# Patient Record
Sex: Male | Born: 1990 | Race: Black or African American | Hispanic: No | Marital: Single | Smoking: Never smoker
Health system: Southern US, Community
[De-identification: ages and names within clinical notes are randomized; demographics above are authoritative.]

## PROBLEM LIST (undated history)

## (undated) HISTORY — PX: FINGER SURGERY: SHX640

## (undated) HISTORY — PX: HERNIA REPAIR: SHX51

## (undated) HISTORY — PX: TONSILLECTOMY: SUR1361

---
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
## 2009-04-07 ENCOUNTER — Emergency Department (HOSPITAL_COMMUNITY): Admission: EM | Admit: 2009-04-07 | Discharge: 2009-04-07 | Payer: Self-pay | Admitting: Emergency Medicine

## 2010-10-15 ENCOUNTER — Other Ambulatory Visit: Payer: Self-pay | Admitting: Surgery

## 2010-10-15 ENCOUNTER — Ambulatory Visit
Admission: RE | Admit: 2010-10-15 | Discharge: 2010-10-15 | Disposition: A | Discharge status: Home / Self Care | Payer: Managed Care, Other (non HMO) | Source: Ambulatory Visit | Attending: Surgery | Admitting: Surgery

## 2010-10-15 ENCOUNTER — Encounter (HOSPITAL_BASED_OUTPATIENT_CLINIC_OR_DEPARTMENT_OTHER)
Admission: RE | Admit: 2010-10-15 | Discharge: 2010-10-15 | Disposition: A | Discharge status: Home / Self Care | Payer: Managed Care, Other (non HMO) | Source: Ambulatory Visit | Attending: Surgery | Admitting: Surgery

## 2010-10-15 DIAGNOSIS — Z01811 Encounter for preprocedural respiratory examination: Secondary | ICD-10-CM

## 2010-10-15 LAB — CBC
HCT: 40.3 % (ref 39.0–52.0)
Hemoglobin: 14.4 g/dL (ref 13.0–17.0)
MCH: 32.8 pg (ref 26.0–34.0)
MCHC: 35.7 g/dL (ref 30.0–36.0)
MCV: 91.8 fL (ref 78.0–100.0)
Platelets: 232 10*3/uL (ref 150–400)
RBC: 4.39 MIL/uL (ref 4.22–5.81)
RDW: 11.2 % — ABNORMAL LOW (ref 11.5–15.5)
WBC: 4.3 10*3/uL (ref 4.0–10.5)

## 2010-10-15 LAB — COMPREHENSIVE METABOLIC PANEL
ALT: 11 U/L (ref 0–53)
AST: 19 U/L (ref 0–37)
Albumin: 4.5 g/dL (ref 3.5–5.2)
Alkaline Phosphatase: 41 U/L (ref 39–117)
BUN: 8 mg/dL (ref 6–23)
CO2: 29 mEq/L (ref 19–32)
Calcium: 9.8 mg/dL (ref 8.4–10.5)
Chloride: 102 mEq/L (ref 96–112)
Creatinine, Ser: 0.75 mg/dL (ref 0.4–1.5)
GFR calc Af Amer: >60 mL/min (ref >60)
GFR calc non Af Amer: >60 mL/min (ref >60)
Glucose, Bld: 88 mg/dL (ref 70–99)
Potassium: 3.7 mEq/L (ref 3.5–5.1)
Sodium: 139 mEq/L (ref 135–145)
Total Bilirubin: 1.3 mg/dL — ABNORMAL HIGH (ref 0.3–1.2)
Total Protein: 7.1 g/dL (ref 6.0–8.3)

## 2010-10-15 LAB — DIFFERENTIAL
Basophils Absolute: 0 10*3/uL (ref 0.0–0.1)
Basophils Relative: 1 % (ref 0–1)
Eosinophils Absolute: 0 10*3/uL (ref 0.0–0.7)
Eosinophils Relative: 1 % (ref 0–5)
Lymphocytes Relative: 44 % (ref 12–46)
Lymphs Abs: 1.9 10*3/uL (ref 0.7–4.0)
Monocytes Absolute: 0.4 10*3/uL (ref 0.1–1.0)
Monocytes Relative: 10 % (ref 3–12)
Neutro Abs: 1.9 10*3/uL (ref 1.7–7.7)
Neutrophils Relative %: 45 % (ref 43–77)

## 2010-10-20 ENCOUNTER — Ambulatory Visit (HOSPITAL_BASED_OUTPATIENT_CLINIC_OR_DEPARTMENT_OTHER)
Admission: RE | Admit: 2010-10-20 | Discharge: 2010-10-20 | Disposition: A | Discharge status: Home / Self Care | Payer: Managed Care, Other (non HMO) | Source: Ambulatory Visit | Attending: Surgery | Admitting: Surgery

## 2010-10-20 DIAGNOSIS — K4090 Unilateral inguinal hernia, without obstruction or gangrene, not specified as recurrent: Secondary | ICD-10-CM | POA: Insufficient documentation

## 2010-10-21 NOTE — Op Note (Signed)
Martin Arnold, Martin Arnold                ACCOUNT NO.:  [id number]  MEDICAL RECORD NO.:  [id number]           PATIENT TYPE:  LOCATION:                                 FACILITY:  PHYSICIAN:  Clovis Pu. Jawana Reagor, M.D.DATE OF BIRTH:  1990-07-26  DATE OF PROCEDURE:  10/20/2010 DATE OF DISCHARGE:                              OPERATIVE REPORT   PREOPERATIVE DIAGNOSIS:  Left inguinal hernia.  POSTOPERATIVE DIAGNOSIS:  Left inguinal hernia.  PROCEDURE:  Repair of left inguinal hernia with Ultrapro mesh.  SURGEON:  Maisie Fus A. Tkeyah Burkman, MD  ANESTHESIA:  LMA with 0.25% Sensorcaine local with epinephrine, total of 20 mL used.  ESTIMATED BLOOD LOSS:  Minimal.  SPECIMENS:  None.  INDICATIONS FOR PROCEDURE:  The patient is a 20 year old male with a left inguinal hernia that has gotten progressively larger and more painful.  We discussed the options of repair to include both opened and laparoscopic use of mesh and repair from both, risk of bleeding, risk of infection, risk of chronic pain, risk of chronic numbness, and chronic groin discomfort, risk of recurrence, the risk of injury to the cord structures and testicle, the risk of injury to the bladder and internal organs to include, but exclusive of the iliac vessels and the bladder and bowel.  He understood all the above.  After discussion of both options as well as observation for therapy, he wished to proceed with open repair of his inguinal hernia.  DESCRIPTION OF PROCEDURE:  The patient was brought to the operating room and placed supine after being seen in the holding area and questions answered.  Left side was marked as well in the holding area.  Once brought back to the operating room, he was placed supine.  LMA anesthesia was initiated without difficulty.  The left inguinal region was prepped and draped in sterile fashion.  Time-out was done.  0.25% Sensorcaine was injected in the left inguinal crease and an incision was made.   Dissection was carried down through the Scarpa fascia to identify the fibers of the external oblique.  These were injected with 0.25% Sensorcaine as well.  The fibers were opened in the direction to the external ring.  Cord structures were identified and circled with a half- inch Penrose drain.  The indirect hernia sac was identified and the colon cords was dissected away and reduced back in the preperitoneal space without difficulty.  We then identified the ilioinguinal nerve and retracted down to the field initially.  A large Prolene hernia system consisting of Ultrapro was used, the inner leaflet was placed through the internal ring into the preperitoneal space without difficulty.  It was deployed and the Onlay was placed on the floor of the anal canal without difficulty.  Slit was cut through the cord structures.  The mesh was secured down the pubic tubercle with 0-Vicryl and the connector piece was secured to the internal oblique with 0-Vicryl.  The onlay was then secured down to the shelving edge of the inguinal ligament and to the conjoint tendon and internal oblique medially with #1 Novafil pop- off sutures.  We then secured the mesh edges  together around the cord with #1 Novafil pop-off.  The ilioinguinal nerve was tethered into the mesh and there was no work without tethered, therefore I went ahead and divided the nerve as the muscle to prevent this from being trapped and having tension on which I think will cause pain in this setting, no matter where I put the nerve.  There is ample room for the cord structures to exit the mesh.  I was able to put my finger into the new internal ring without difficulty.  We then closed the external oblique with 2-0 Vicryl.  3-0 Vicryl was used to close the Scarpa fascia and 4-0 Monocryl was used to close the skin in a subcuticular fashion. Dermabond was applied.  All final counts of sponge, needle, and instruments were found to be correct at  this portion of the case.  The patient was awoke, extubated, taken to recovery in satisfactory condition.  All final counts were found to be correct.     Landrum Carbonell A. Lenni Reckner, M.D.     TAC/MEDQ  D:  10/20/2010  T:  10/20/2010  Job:  [id number]  Electronically Signed by Harriette Bouillon M.D. on 10/21/2010 12:17:33 PM

## 2010-11-04 ENCOUNTER — Other Ambulatory Visit (INDEPENDENT_AMBULATORY_CARE_PROVIDER_SITE_OTHER): Payer: Self-pay | Admitting: Surgery

## 2010-11-04 LAB — URINALYSIS, ROUTINE W REFLEX MICROSCOPIC
Bilirubin Urine: NEGATIVE
Glucose, UA: NEGATIVE mg/dL
Hgb urine dipstick: NEGATIVE
Ketones, ur: NEGATIVE mg/dL
Leukocytes, UA: NEGATIVE
Nitrite: NEGATIVE
Protein, ur: NEGATIVE mg/dL
Specific Gravity, Urine: 1.03 (ref 1.005–1.030)
Urobilinogen, UA: 0.2 mg/dL (ref 0.0–1.0)
pH: 5.5 (ref 5.0–8.0)

## 2010-11-06 LAB — URINE CULTURE
Colony Count: NO GROWTH
Organism ID, Bacteria: NO GROWTH

## 2017-08-25 DIAGNOSIS — R0683 Snoring: Secondary | ICD-10-CM | POA: Insufficient documentation

## 2019-05-11 ENCOUNTER — Other Ambulatory Visit: Payer: Self-pay

## 2019-05-11 DIAGNOSIS — Z20822 Contact with and (suspected) exposure to covid-19: Secondary | ICD-10-CM

## 2019-05-13 LAB — NOVEL CORONAVIRUS, NAA: SARS-CoV-2, NAA: NOT DETECTED

## 2020-04-05 ENCOUNTER — Ambulatory Visit
Admission: EM | Admit: 2020-04-05 | Discharge: 2020-04-05 | Disposition: A | Discharge status: Home / Self Care | Payer: PRIVATE HEALTH INSURANCE | Attending: Family Medicine | Admitting: Family Medicine

## 2020-04-05 ENCOUNTER — Other Ambulatory Visit: Payer: Self-pay

## 2020-04-05 DIAGNOSIS — M542 Cervicalgia: Secondary | ICD-10-CM | POA: Diagnosis not present

## 2020-04-05 DIAGNOSIS — M791 Myalgia, unspecified site: Secondary | ICD-10-CM

## 2020-04-05 MED ORDER — CYCLOBENZAPRINE HCL 5 MG PO TABS: 10.0000 mg | ORAL_TABLET | Freq: Two times a day (BID) | ORAL | 0 refills | Status: DC | PRN

## 2020-04-05 MED ORDER — KETOROLAC TROMETHAMINE 60 MG/2ML IM SOLN
60.0000 mg | Freq: Once | INTRAMUSCULAR | Status: AC
  Administered 2020-04-05: 60 mg via INTRAMUSCULAR

## 2020-04-05 MED ORDER — DEXAMETHASONE SODIUM PHOSPHATE 10 MG/ML IJ SOLN
10.0000 mg | Freq: Once | INTRAMUSCULAR | Status: AC
  Administered 2020-04-05: 10 mg via INTRAMUSCULAR

## 2020-04-05 MED ORDER — NAPROXEN 375 MG PO TABS: 375.0000 mg | ORAL_TABLET | Freq: Two times a day (BID) | ORAL | 0 refills | Status: DC
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
## 2020-04-05 NOTE — Discharge Instructions (Addendum)
Start naproxen 375 twice daily twice daily for the next 3 days then as needed. Cyclobenzaprine 5-10 mg a needed for pain. Medication can cause severe drowsiness therefore do not take medication while at work or when driving.

## 2020-04-16 ENCOUNTER — Telehealth: Payer: Self-pay | Admitting: General Practice

## 2020-04-16 NOTE — Telephone Encounter (Signed)
This patient was referred to see you from his mother Kathrynn Humble MRN [id number] and her daughter Zetta Bills MRN [id number] sees you also.  Can I schedule this patient?

## 2020-04-17 NOTE — Telephone Encounter (Signed)
Okay to schedule

## 2020-04-17 NOTE — Telephone Encounter (Signed)
Patient is scheduled for 11/23. 

## 2020-04-23 ENCOUNTER — Ambulatory Visit (INDEPENDENT_AMBULATORY_CARE_PROVIDER_SITE_OTHER): Payer: PRIVATE HEALTH INSURANCE

## 2020-04-23 ENCOUNTER — Ambulatory Visit (INDEPENDENT_AMBULATORY_CARE_PROVIDER_SITE_OTHER): Payer: PRIVATE HEALTH INSURANCE | Admitting: Family Medicine

## 2020-04-23 ENCOUNTER — Other Ambulatory Visit: Payer: Self-pay

## 2020-04-23 ENCOUNTER — Encounter: Payer: Self-pay | Admitting: Family Medicine

## 2020-04-23 VITALS — BP 116/80 | HR 74 | Temp 98.8°F | Resp 16 | Ht 65.5 in | Wt 210.4 lb

## 2020-04-23 DIAGNOSIS — Z13228 Encounter for screening for other metabolic disorders: Secondary | ICD-10-CM | POA: Diagnosis not present

## 2020-04-23 DIAGNOSIS — Z13220 Encounter for screening for lipoid disorders: Secondary | ICD-10-CM

## 2020-04-23 DIAGNOSIS — G4730 Sleep apnea, unspecified: Secondary | ICD-10-CM

## 2020-04-23 DIAGNOSIS — M549 Dorsalgia, unspecified: Secondary | ICD-10-CM

## 2020-04-23 DIAGNOSIS — Z1329 Encounter for screening for other suspected endocrine disorder: Secondary | ICD-10-CM

## 2020-04-23 DIAGNOSIS — K219 Gastro-esophageal reflux disease without esophagitis: Secondary | ICD-10-CM | POA: Insufficient documentation

## 2020-04-23 DIAGNOSIS — Z0000 Encounter for general adult medical examination without abnormal findings: Secondary | ICD-10-CM

## 2020-04-23 DIAGNOSIS — Z130 Encounter for screening for diseases of the blood and blood-forming organs and certain disorders involving the immune mechanism: Secondary | ICD-10-CM

## 2020-04-23 LAB — BASIC METABOLIC PANEL WITHOUT GFR
BUN: 10 mg/dL (ref 7–25)
CO2: 29 mmol/L (ref 20–32)
Calcium: 10 mg/dL (ref 8.6–10.3)
Chloride: 103 mmol/L (ref 98–110)
Creat: 0.99 mg/dL (ref 0.60–1.35)
GFR, Est African American: 117 mL/min/{1.73_m2}
GFR, Est Non African American: 101 mL/min/{1.73_m2}
Glucose, Bld: 90 mg/dL (ref 65–99)
Potassium: 4.6 mmol/L (ref 3.5–5.3)
Sodium: 140 mmol/L (ref 135–146)

## 2020-04-23 LAB — LIPID PANEL
Cholesterol: 183 mg/dL (ref <200)
HDL: 51 mg/dL (ref >=40)
LDL Cholesterol (Calc): 116 mg/dL (calc) — ABNORMAL HIGH
Non-HDL Cholesterol (Calc): 132 mg/dL (calc) — ABNORMAL HIGH (ref <130)
Total CHOL/HDL Ratio: 3.6 (calc) (ref <5.0)
Triglycerides: 70 mg/dL (ref <150)

## 2020-04-23 MED ORDER — ESOMEPRAZOLE MAGNESIUM 20 MG PO CPDR: 20.0000 mg | DELAYED_RELEASE_CAPSULE | Freq: Every day | ORAL | 5 refills | Status: DC

## 2020-04-23 NOTE — Patient Instructions (Addendum)
A few things to remember from today's visit:   Acute bilateral back pain, unspecified back location - Plan: DG Thoracic Spine W/Swimmers, DG Lumbar Spine Complete, Ambulatory referral to Physical Therapy  Gastroesophageal reflux disease without esophagitis - Plan: esomeprazole (NEXIUM) 20 MG capsule  Routine general medical examination at a health care facility  Screening for lipoid disorders - Plan: Lipid panel  Screening for endocrine, metabolic and immunity disorder - Plan: BASIC METABOLIC PANEL WITH GFR  Sleep apnea, unspecified type - Plan: Nocturnal polysomnography  If you need refills please call your pharmacy. Do not use My Chart to request refills or for acute issues that need immediate attention.   Please be sure medication list is accurate. If a new problem present, please set up appointment sooner than planned today.  At least 150 minutes of moderate exercise per week, daily brisk walking for 15-30 min is a good exercise option. Healthy diet low in saturated (animal) fats and sweets and consisting of fresh fruits and vegetables, lean meats such as fish and white chicken and whole grains.  - Vaccines:  Tdap vaccine every 10 years.  Shingles vaccine recommended at age 43, could be given after 31 years of age but not sure about insurance coverage.  Pneumonia vaccines: Pneumovax at 665   -Screening recommendations for low/normal risk males:  Screening for diabetes at age 86 and every 3 years. Earlier screening if cardiovascular risk factors.   Lipid screening at 35 and every 3 years. Screening starts in younger males with cardiovascular risk factors.  Colon cancer screening is now at age 53 but your insurance may not cover until age 70 .screening is recommended age 48.  Prostate cancer screening: some controversy, starts usually at 50: Rectal exam and PSA.  Aortic Abdominal Aneurism once between 91 and 55 years old if ever smoker.  Also recommended:  1. Dental  visit- Brush and floss your teeth twice daily; visit your dentist twice a year. 2. Eye doctor- Get an eye exam at least every 2 years. 3. Helmet use- Always wear a helmet when riding a bicycle, motorcycle, rollerblading or skateboarding. 4. Safe sex- If you may be exposed to sexually transmitted infections, use a condom. 5. Seat belts- Seat belts can save your live; always wear one. 6. Smoke/Carbon Monoxide detectors- These detectors need to be installed on the appropriate level of your home. Replace batteries at least once a year. 7. Skin cancer- When out in the sun please cover up and use sunscreen 15 SPF or higher. 8. Violence- If anyone is threatening or hurting you, please tell your healthcare provider.  9. Drink alcohol in moderation- Limit alcohol intake to one drink or less per day. Never drink and drive.
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
## 2020-06-12 ENCOUNTER — Telehealth: Payer: Self-pay | Admitting: Family Medicine

## 2020-06-12 ENCOUNTER — Encounter: Payer: Self-pay | Admitting: Physical Therapy

## 2020-06-12 ENCOUNTER — Ambulatory Visit: Payer: No Typology Code available for payment source | Attending: Family Medicine | Admitting: Physical Therapy

## 2020-06-12 ENCOUNTER — Other Ambulatory Visit: Payer: Self-pay

## 2020-06-12 DIAGNOSIS — M62830 Muscle spasm of back: Secondary | ICD-10-CM | POA: Diagnosis present

## 2020-06-12 DIAGNOSIS — M546 Pain in thoracic spine: Secondary | ICD-10-CM | POA: Diagnosis present

## 2020-06-12 DIAGNOSIS — M545 Low back pain, unspecified: Secondary | ICD-10-CM | POA: Insufficient documentation

## 2020-06-12 DIAGNOSIS — M25531 Pain in right wrist: Secondary | ICD-10-CM | POA: Diagnosis present

## 2020-06-12 NOTE — Patient Instructions (Addendum)
Access Code: [email or web address] URL: [email or web address] Date: 06/12/2020 Prepared by: Raynelle Fanning  Exercises Sidelying Thoracic Rotation with Open Book - 1 x daily - 7 x weekly - 2 sets - 5 reps  Patient Education Trigger Point Dry Needling TENS Unit

## 2020-06-12 NOTE — Telephone Encounter (Signed)
Pt's appt has been scheduled - referral coordinator will let pt know.
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
## 2020-06-12 NOTE — Telephone Encounter (Signed)
Patient called and stated that he was supposed to be referred to get a sleep study but hasn't heard back yet, please advise. CB is [phone]

## 2020-06-24 ENCOUNTER — Ambulatory Visit: Payer: No Typology Code available for payment source | Admitting: Physical Therapy

## 2020-06-24 ENCOUNTER — Other Ambulatory Visit: Payer: Self-pay

## 2020-06-24 ENCOUNTER — Encounter: Payer: Self-pay | Admitting: Physical Therapy

## 2020-06-24 DIAGNOSIS — M545 Low back pain, unspecified: Secondary | ICD-10-CM

## 2020-06-24 DIAGNOSIS — M25531 Pain in right wrist: Secondary | ICD-10-CM

## 2020-06-24 DIAGNOSIS — M62830 Muscle spasm of back: Secondary | ICD-10-CM

## 2020-06-24 DIAGNOSIS — M546 Pain in thoracic spine: Secondary | ICD-10-CM | POA: Diagnosis not present

## 2020-06-24 NOTE — Patient Instructions (Signed)
Access Code: [email or web address] URL: [email or web address] Date: 06/24/2020 Prepared by: Eulis Foster  Exercises Sidelying Thoracic Rotation with Open Book - 1 x daily - 7 x weekly - 2 sets - 5 reps Supine Hamstring Stretch - 1 x daily - 7 x weekly - 1 sets - 2 reps - 30 sec hold Supine Piriformis Stretch with Leg Straight - 1 x daily - 7 x weekly - 1 sets - 2 reps - 30 sec hold Supine Active Straight Leg Raise - 1 x daily - 7 x weekly - 1 sets - 10 reps  Patient Education Trigger Point Dry Needling TENS Unit [facility] [address], [address] [place], [place] [address] Phone # [phone] Fax [phone]

## 2020-06-24 NOTE — Therapy (Signed)
[facility] [address], [address] [place], [place], [address] Phone: [phone]   Fax:  [phone]  Physical Therapy Treatment  Patient Details  Name: Martin Arnold MRN: [id number] Date of Birth: [birth year] Referring Provider (PT): Betty Swaziland MD   Encounter Date: 06/24/2020   PT End of Session - 06/24/20 1637    Visit Number 2    Date for PT Re-Evaluation 08/07/20    Authorization - Visit Number 2    Authorization - Number of Visits 40    PT Start Time 1550   came late   PT Stop Time 1649    PT Time Calculation (min) 59 min    Activity Tolerance Patient tolerated treatment well;No increased pain    Behavior During Therapy [facility] for tasks assessed/performed           History reviewed. No pertinent past medical history.  History reviewed. No pertinent surgical history.  There were no vitals filed for this visit.   Subjective Assessment - 06/24/20 1556    Subjective Patient reports the dry needling and the electrical stimulation has helped with pain.    How long can you sit comfortably? 2 hours    Diagnostic tests xray negative    Patient Stated Goals get rid of spasms and pain    Currently in Pain? Yes    Pain Score 5     Pain Location Back    Pain Orientation Right;Mid    Pain Descriptors / Indicators Spasm;Throbbing    Pain Type Acute pain    Pain Onset More than a month ago    Pain Frequency Constant    Aggravating Factors  laying on it    Pain Relieving Factors massage    Multiple Pain Sites No                             [facility] Adult PT Treatment/Exercise - 06/24/20 0001      Lumbar Exercises: Stretches   Active Hamstring Stretch Right;Left;1 rep;30 seconds    Active Hamstring Stretch Limitations supine    Lower Trunk Rotation 1 rep;30 seconds    Lower Trunk Rotation Limitations right, left in supine pulling knee across body    Other Lumbar Stretch Exercise Open book in sidely to  stretch the spine 5x each side      Lumbar Exercises: Supine   Ab Set 10 reps;5 seconds    AB Set Limitations tactile cues to engage the abdominal corectly    Bent Knee Raise 20 reps;1 second    Bent Knee Raise Limitations with abdominal bracing    Dead Bug 20 reps;1 second    Dead Bug Limitations with core engaged    Straight Leg Raise 20 reps;1 second    Straight Leg Raises Limitations with abdominal contraction      Modalities   Modalities Electrical Stimulation;Moist Heat      Moist Heat Therapy   Moist Heat Location Lumbar Spine;Other (comment)   thoracic spine     Electrical Stimulation   Electrical Stimulation Location bil lumbar and right thoracic paraspinals    Electrical Stimulation Action IFC    Electrical Stimulation Parameters 80-150Hz  ; 15 min    Electrical Stimulation Goals Pain;Tone      Manual Therapy   Manual Therapy Joint mobilization;Myofascial release    Manual therapy comments Skilled palpation and monitoring of soft tissues during DN    Joint Mobilization PA mobilization to T4-L3  grade 3    Myofascial Release using the suction cup to pull the skin off the muscle to mobilize the fascia of the thoracic and lumbar area; using the Iastik device to mobilize the soft tissue of the thoracic and lumbar area            Trigger Point Dry Needling - 06/24/20 0001    Consent Given? Yes    Education Handout Provided Previously provided    Muscles Treated Back/Hip Erector spinae    Dry Needling Comments right thoracic/lumbar    Erector spinae Response Twitch response elicited;Palpable increased muscle length                PT Education - 06/24/20 1637    Education Details Access Code: [email or web address]    Person(s) Educated Patient    Methods Explanation;Demonstration;Verbal cues;Handout    Comprehension Returned demonstration;Verbalized understanding            PT Short Term Goals - 06/12/20 1128      PT SHORT TERM GOAL #1   Title Ind with initial HEP     Time 2    Period Weeks    Status New    Target Date 06/26/20      PT SHORT TERM GOAL #2   Title Decreased pain and spasm in thoracic/lumbar spine by >=25%    Time 2    Period Weeks    Status New    Target Date 06/26/20      PT SHORT TERM GOAL #3   Title Patient able to lie on his back without limitation due to pain.    Time 4    Period Weeks    Status New    Target Date 07/10/20             PT Long Term Goals - 06/12/20 1128      PT LONG TERM GOAL #1   Title decreased thoracic and lumbar spine pain by >= 80% with ADLs    Time 8    Period Weeks    Status New    Target Date 08/07/20      PT LONG TERM GOAL #2   Title Patient able to return to his normal workout and basketball activity without limitations from back pain.    Time 8    Status New      PT LONG TERM GOAL #3   Title Patient indendent with HEP to maintain therapy gains.    Time 8    Period Weeks    Status New                 Plan - 06/24/20 1604    Clinical Impression Statement Pain decreased from a 5/10 to a 2/10 after therapy. Patient reports the dry needling and the electrical stimulation has helped him. He has trigger points in the right erector spinae. Patient had to adjust his sitting position several times due to discomfort. Patient has increased tissue mobility after manual work. Patient was able to engage his lower abdominals but needed verbal and tactile cues. Patient was able to do his core exercises without increased pain. Patient will benefit from skilled therapy to address muscle pain, decreased mobility and strength.    Stability/Clinical Decision Making Stable/Uncomplicated    Rehab Potential Excellent    PT Frequency 2x / week    PT Duration 8 weeks    PT Treatment/Interventions ADLs/Self Care Home Management;Cryotherapy;Electrical Stimulation;Moist Heat;Therapeutic exercise;Therapeutic activities;Neuromuscular re-education;Manual techniques;Dry needling;Patient/family  education;Taping;Spinal Manipulations  PT Next Visit Plan Continue with DN/manual therapy to thoracic/lumbar; progress to core/stabilization; assess wrist if MD okays cert    PT Home Exercise Plan [email or web address]    Consulted and Agree with Plan of Care Patient           Patient will benefit from skilled therapeutic intervention in order to improve the following deficits and impairments:  Increased muscle spasms,Pain,Hypomobility,Impaired flexibility,Decreased strength,Decreased activity tolerance  Visit Diagnosis: Pain in thoracic spine  Acute bilateral low back pain without sciatica  Pain in right wrist  Muscle spasm of back     Problem List Patient Active Problem List   Diagnosis Date Noted  . GERD (gastroesophageal reflux disease) 04/23/2020    Eulis Foster, PT 06/24/20 4:53 PM   [facility] [address] [place], [place], [address] Phone: [phone]   Fax:  [phone]  Name: Raymundo Rout MRN: [id number] Date of Birth: [birth year]

## 2020-07-02 ENCOUNTER — Encounter: Payer: PRIVATE HEALTH INSURANCE | Admitting: Physical Therapy

## 2020-07-04 ENCOUNTER — Other Ambulatory Visit: Payer: Self-pay

## 2020-07-04 ENCOUNTER — Ambulatory Visit: Payer: No Typology Code available for payment source | Attending: Family Medicine

## 2020-07-04 DIAGNOSIS — M62830 Muscle spasm of back: Secondary | ICD-10-CM | POA: Diagnosis present

## 2020-07-04 DIAGNOSIS — M545 Low back pain, unspecified: Secondary | ICD-10-CM | POA: Diagnosis present

## 2020-07-04 DIAGNOSIS — M546 Pain in thoracic spine: Secondary | ICD-10-CM | POA: Diagnosis present

## 2020-07-04 NOTE — Therapy (Signed)
[facility] [address] [place], [place], [address] Phone: [phone]   Fax:  [phone]  Physical Therapy Treatment  Patient Details  Name: Martin Arnold MRN: [id number] Date of Birth: [birth year] Referring Provider (PT): Betty Swaziland MD   Encounter Date: 07/04/2020   PT End of Session - 07/04/20 0845    Visit Number 3    Date for PT Re-Evaluation 08/07/20    PT Start Time 0814    PT Stop Time 0858    PT Time Calculation (min) 44 min    Activity Tolerance Patient tolerated treatment well;No increased pain    Behavior During Therapy [facility] for tasks assessed/performed           History reviewed. No pertinent past medical history.  History reviewed. No pertinent surgical history.  There were no vitals filed for this visit.   Subjective Assessment - 07/04/20 0810    Subjective I am feeling good today.  40-50% overall improvement.    Currently in Pain? Yes    Pain Score 3     Pain Location Back    Pain Orientation Right;Left;Mid    Pain Descriptors / Indicators Sharp;Throbbing    Pain Type Acute pain    Pain Onset More than a month ago    Pain Frequency Constant                             [facility] Adult PT Treatment/Exercise - 07/04/20 0001      Exercises   Exercises Lumbar      Lumbar Exercises: Quadruped   Madcat/Old Horse 10 reps    Other Quadruped Lumbar Exercises Child's pose: 3x20", lateral to the Lt 2x20 seconds      Modalities   Modalities Electrical Stimulation;Moist Heat      Moist Heat Therapy   Number Minutes Moist Heat 15 Minutes    Moist Heat Location Lumbar Spine;Other (comment)   thoracic spine     Electrical Stimulation   Electrical Stimulation Location bil lumbar and right thoracic paraspinals    Electrical Stimulation Action IFC    Electrical Stimulation Parameters 15 minutes    Electrical Stimulation Goals Pain;Tone      Manual Therapy   Manual Therapy  Joint mobilization;Myofascial release    Manual therapy comments Skilled palpation and monitoring of soft tissues during DN    Joint Mobilization elongation and mobilization to Rt thoracolumbar paraspinals and lats            Trigger Point Dry Needling - 07/04/20 0001    Consent Given? Yes    Education Handout Provided Previously provided    Muscles Treated Upper Quadrant Latissimus dorsi   Rt   Muscles Treated Back/Hip Erector spinae    Dry Needling Comments right thoracic/lumbar    Latissimus dorsi Response Twitch response elicited;Palpable increased muscle length    Erector spinae Response Twitch response elicited;Palpable increased muscle length                PT Education - 07/04/20 0822    Education Details Access Code: [email or web address]    Person(s) Educated Patient    Methods Explanation;Demonstration;Handout    Comprehension Verbalized understanding;Returned demonstration            PT Short Term Goals - 07/04/20 0812      PT SHORT TERM GOAL #1   Title Ind with initial HEP    Status Achieved  PT SHORT TERM GOAL #2   Title Decreased pain and spasm in thoracic/lumbar spine by >=25%    Baseline 40-50%    Status Achieved      PT SHORT TERM GOAL #3   Title Patient able to lie on his back without limitation due to pain.    Status On-going             PT Long Term Goals - 06/12/20 1128      PT LONG TERM GOAL #1   Title decreased thoracic and lumbar spine pain by >= 80% with ADLs    Time 8    Period Weeks    Status New    Target Date 08/07/20      PT LONG TERM GOAL #2   Title Patient able to return to his normal workout and basketball activity without limitations from back pain.    Time 8    Status New      PT LONG TERM GOAL #3   Title Patient indendent with HEP to maintain therapy gains.    Time 8    Period Weeks    Status New                 Plan - 07/04/20 0850    Clinical Impression Statement Pt reports 40-50% improvement in  symptoms since the start of care. Patient reports the dry needling and the electrical stimulation has helped him. He has trigger points in the right erector spinae and lats. PT added more thoracic mobility exercises to HEP. Pt had multiple twitch responses with dry needling today.  Pt requires tactile and verbal cues to improve segmental mobility.  Patient was able to do his core exercises without increased pain. Patient will benefit from skilled therapy to address muscle pain, decreased mobility and strength.    PT Frequency 2x / week    PT Duration 8 weeks    PT Treatment/Interventions ADLs/Self Care Home Management;Cryotherapy;Electrical Stimulation;Moist Heat;Therapeutic exercise;Therapeutic activities;Neuromuscular re-education;Manual techniques;Dry needling;Patient/family education;Taping;Spinal Manipulations    PT Next Visit Plan Continue with DN/manual therapy to thoracic/lumbar; progress to core/stabilization; assess wrist if MD okays cert    PT Home Exercise Plan [email or web address]    Consulted and Agree with Plan of Care Patient           Patient will benefit from skilled therapeutic intervention in order to improve the following deficits and impairments:  Increased muscle spasms,Pain,Hypomobility,Impaired flexibility,Decreased strength,Decreased activity tolerance  Visit Diagnosis: Pain in thoracic spine  Acute bilateral low back pain without sciatica  Muscle spasm of back     Problem List Patient Active Problem List   Diagnosis Date Noted  . GERD (gastroesophageal reflux disease) 04/23/2020    Lorrene Reid, PT 07/04/20 8:51 AM  [facility] [address] [place], [place], [address] Phone: [phone]   Fax:  [phone]  Name: Martin Arnold MRN: [id number] Date of Birth: 1990-10-09

## 2020-07-04 NOTE — Patient Instructions (Signed)
Access Code: [email or web address] URL: [email or web address] Date: 07/04/2020 Prepared by: Tresa Endo  Exercises  Cat-Camel - 2 x daily - 7 x weekly - 10 reps - 1 sets - 5 hold Child's Pose Stretch - 1 x daily - 7 x weekly - 1 sets - 3 reps - 20 hold Child's Pose with Sidebending - 1 x daily - 7 x weekly - 1 sets - 3 reps - 20 hold

## 2020-07-09 ENCOUNTER — Ambulatory Visit: Payer: No Typology Code available for payment source

## 2020-07-09 ENCOUNTER — Other Ambulatory Visit: Payer: Self-pay

## 2020-07-09 DIAGNOSIS — M546 Pain in thoracic spine: Secondary | ICD-10-CM

## 2020-07-09 DIAGNOSIS — M545 Low back pain, unspecified: Secondary | ICD-10-CM

## 2020-07-09 DIAGNOSIS — M62830 Muscle spasm of back: Secondary | ICD-10-CM

## 2020-07-09 NOTE — Therapy (Signed)
[facility] [address] [place], [place], [address] Phone: [phone]   Fax:  [phone]  Physical Therapy Treatment  Patient Details  Name: Martin Arnold MRN: [id number] Date of Birth: [birth year] Referring Provider (PT): Betty Swaziland MD   Encounter Date: 07/09/2020   PT End of Session - 07/09/20 0846    Visit Number 4    Date for PT Re-Evaluation 08/07/20    Authorization - Visit Number 4    Authorization - Number of Visits 40    PT Start Time 0802    PT Stop Time 0855    PT Time Calculation (min) 53 min    Activity Tolerance Patient tolerated treatment well;No increased pain    Behavior During Therapy [facility] for tasks assessed/performed           History reviewed. No pertinent past medical history.  History reviewed. No pertinent surgical history.  There were no vitals filed for this visit.   Subjective Assessment - 07/09/20 0809    Subjective I am doing well.  I am doing the flexibility exercises.    Currently in Pain? Yes    Pain Score 4     Pain Location Back    Pain Orientation Right;Left;Mid    Pain Descriptors / Indicators Throbbing;Sharp    Pain Type Acute pain    Pain Onset More than a month ago    Pain Frequency Constant    Aggravating Factors  laying on it, movement    Pain Relieving Factors massage, heat/stim                             [facility] Adult PT Treatment/Exercise - 07/09/20 0001      Exercises   Exercises Shoulder      Lumbar Exercises: Stretches   Other Lumbar Stretch Exercise Open book in sidelying with foam roll to stretch the spine 10x each side      Lumbar Exercises: Aerobic   UBE (Upper Arm Bike) Level 1x 4 minutes (2/2)-PT present to discuss progress      Shoulder Exercises: Supine   Horizontal ABduction Strengthening;Both;20 reps;Theraband    Theraband Level (Shoulder Horizontal ABduction) Level 3 (Green)    External Rotation Strengthening;Both;20  reps;Theraband    Theraband Level (Shoulder External Rotation) Level 3 (Green)      Moist Heat Therapy   Number Minutes Moist Heat 15 Minutes    Moist Heat Location Lumbar Spine;Other (comment)      Programme researcher, broadcasting/film/video Location bil lumbar and right thoracic paraspinals    Electrical Stimulation Action IFC    Electrical Stimulation Parameters 15 minutes    Electrical Stimulation Goals Pain;Tone      Manual Therapy   Manual Therapy Joint mobilization;Myofascial release    Manual therapy comments Skilled palpation and monitoring of soft tissues during DN    Joint Mobilization elongation and mobilization to Rt thoracolumbar paraspinals and lats            Trigger Point Dry Needling - 07/09/20 0001    Consent Given? Yes    Education Handout Provided Previously provided    Muscles Treated Upper Quadrant Latissimus dorsi   Rt   Muscles Treated Back/Hip Erector spinae    Dry Needling Comments right thoracic/lumbar    Latissimus dorsi Response Twitch response elicited;Palpable increased muscle length    Erector spinae Response Twitch response elicited;Palpable increased muscle length  PT Short Term Goals - 07/04/20 6606      PT SHORT TERM GOAL #1   Title Ind with initial HEP    Status Achieved      PT SHORT TERM GOAL #2   Title Decreased pain and spasm in thoracic/lumbar spine by >=25%    Baseline 40-50%    Status Achieved      PT SHORT TERM GOAL #3   Title Patient able to lie on his back without limitation due to pain.    Status On-going             PT Long Term Goals - 06/12/20 1128      PT LONG TERM GOAL #1   Title decreased thoracic and lumbar spine pain by >= 80% with ADLs    Time 8    Period Weeks    Status New    Target Date 08/07/20      PT LONG TERM GOAL #2   Title Patient able to return to his normal workout and basketball activity without limitations from back pain.    Time 8    Status New      PT  LONG TERM GOAL #3   Title Patient indendent with HEP to maintain therapy gains.    Time 8    Period Weeks    Status New                 Plan - 07/09/20 3016    Clinical Impression Statement Pt continues to report 40-50% improvement in symptoms since the start of care. He has trigger points in the right erector spinae and lats. PT added more thoracic mobility exercises to HEP last session and pt was able to demonstrate correctly today. Pt did well with supine strength exercises today without increase in pain. Pt had multiple twitch responses with dry needling today.  Pt requires tactile and verbal cues to improve segmental mobility.   Patient will benefit from skilled therapy to address muscle pain, decreased mobility and strength.    PT Frequency 2x / week    PT Duration 8 weeks    PT Treatment/Interventions ADLs/Self Care Home Management;Cryotherapy;Electrical Stimulation;Moist Heat;Therapeutic exercise;Therapeutic activities;Neuromuscular re-education;Manual techniques;Dry needling;Patient/family education;Taping;Spinal Manipulations    PT Next Visit Plan Continue with DN/manual therapy to thoracic/lumbar; progress to core/stabilization; add theraband to HEP    PT Home Exercise Plan [email or web address]    Consulted and Agree with Plan of Care Patient           Patient will benefit from skilled therapeutic intervention in order to improve the following deficits and impairments:  Increased muscle spasms,Pain,Hypomobility,Impaired flexibility,Decreased strength,Decreased activity tolerance  Visit Diagnosis: Pain in thoracic spine  Muscle spasm of back  Acute bilateral low back pain without sciatica     Problem List Patient Active Problem List   Diagnosis Date Noted  . GERD (gastroesophageal reflux disease) 04/23/2020    Lorrene Reid, PT 07/09/20 8:48 AM  [facility] [address], [address] [place], [place], [address] Phone:  [phone]   Fax:  [phone]  Name: Martin Arnold MRN: [id number] Date of Birth: 1990/11/08

## 2020-07-11 ENCOUNTER — Other Ambulatory Visit: Payer: Self-pay

## 2020-07-11 ENCOUNTER — Ambulatory Visit: Payer: No Typology Code available for payment source

## 2020-07-11 DIAGNOSIS — M545 Low back pain, unspecified: Secondary | ICD-10-CM

## 2020-07-11 DIAGNOSIS — M62830 Muscle spasm of back: Secondary | ICD-10-CM

## 2020-07-11 DIAGNOSIS — M546 Pain in thoracic spine: Secondary | ICD-10-CM

## 2020-07-11 NOTE — Patient Instructions (Signed)
Access Code: [email or web address] URL: [email or web address] Date: 07/11/2020 Prepared by: Tresa Endo  Exercises  Supine Shoulder Horizontal Abduction with Resistance - 2 x daily - 7 x weekly - 10 reps - 2 sets Supine Bilateral Shoulder External Rotation with Resistance - 2 x daily - 7 x weekly - 10 reps - 2 sets Supine PNF D2 Flexion with Resistance - 2 x daily - 7 x weekly - 10 reps - 2 sets
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
## 2020-07-21 ENCOUNTER — Encounter (HOSPITAL_BASED_OUTPATIENT_CLINIC_OR_DEPARTMENT_OTHER): Payer: PRIVATE HEALTH INSURANCE | Admitting: Internal Medicine

## 2020-08-01 ENCOUNTER — Other Ambulatory Visit: Payer: Self-pay

## 2020-08-01 ENCOUNTER — Ambulatory Visit: Payer: No Typology Code available for payment source | Attending: Family Medicine | Admitting: Physical Therapy

## 2020-08-01 ENCOUNTER — Encounter: Payer: Self-pay | Admitting: Physical Therapy

## 2020-08-01 DIAGNOSIS — M62830 Muscle spasm of back: Secondary | ICD-10-CM | POA: Insufficient documentation

## 2020-08-01 DIAGNOSIS — M546 Pain in thoracic spine: Secondary | ICD-10-CM | POA: Diagnosis present

## 2020-08-01 NOTE — Therapy (Addendum)
[facility] [address] [place], [place], [address] Phone: [phone]   Fax:  [phone]  Physical Therapy Treatment  Patient Details  Name: Martin Arnold MRN: [id number] Date of Birth: 01/27/1991 Referring Provider (PT): Betty Martinique MD   Encounter Date: 08/01/2020   PT End of Session - 08/01/20 0802     Visit Number 6    Date for PT Re-Evaluation 08/07/20    Authorization - Visit Number 6    Authorization - Number of Visits 40    PT Start Time 0802    PT Stop Time 6283    PT Time Calculation (min) 55 min    Activity Tolerance Patient tolerated treatment well             History reviewed. No pertinent past medical history.  History reviewed. No pertinent surgical history.  There were no vitals filed for this visit.   Subjective Assessment - 08/01/20 0805     Subjective I'm still feeling the area where the pain used to be but it's more of a pull.    Patient Stated Goals get rid of spasms and pain    Currently in Pain? No/denies                               [facility] Adult PT Treatment/Exercise - 08/01/20 0001       Self-Care   Self-Care Other Self-Care Comments    Other Self-Care Comments  self massage with ball to thoracic paraspinals x 2 min      Lumbar Exercises: Stretches   Other Lumbar Stretch Exercise Open book in sidelying to stretch the spine 10x each side with deep breaths to increase range      Lumbar Exercises: Seated   Other Seated Lumbar Exercises childs pose straight and to the left and right on swiss ball for upper back stretch x 10 breaths      Shoulder Exercises: Supine   Horizontal ABduction Strengthening;Both;20 reps;Theraband    Theraband Level (Shoulder Horizontal ABduction) Level 4 (Blue)    External Rotation Strengthening;Both;Theraband;12 reps    Theraband Level (Shoulder External Rotation) Level 4 (Blue)    Diagonals Strengthening;Theraband;10 reps     Theraband Level (Shoulder Diagonals) Level 4 (Blue)      Shoulder Exercises: ROM/Strengthening   UBE (Upper Arm Bike) L3 x 6 min    Lat Pull 10 reps    Lat Pull Limitations 30#    Cybex Row 10 reps    Cybex Row Limitations 30#    Other ROM/Strengthening Exercises lawn mower pull 20# x 10      Moist Heat Therapy   Number Minutes Moist Heat 10 Minutes    Moist Heat Location Other (comment)   thoracic spine right     Electrical Stimulation   Electrical Stimulation Location right thoracic paraspinals    Electrical Stimulation Action premod    Electrical Stimulation Parameters 10    Electrical Stimulation Goals Tone                    PT Education - 08/01/20 1445     Education Details HEP; Self MFR with ball    Person(s) Educated Patient    Methods Explanation;Demonstration;Handout    Comprehension Verbalized understanding;Returned demonstration              PT Short Term Goals - 08/01/20 0806       PT SHORT  TERM GOAL #1   Title Ind with initial HEP    Status Achieved      PT SHORT TERM GOAL #2   Title Decreased pain and spasm in thoracic/lumbar spine by >=25%    Status Achieved      PT SHORT TERM GOAL #3   Title Patient able to lie on his back without limitation due to pain.    Baseline much improved but still somewhat limiting    Status Not Met               PT Long Term Goals - 08/01/20 3220       PT LONG TERM GOAL #1   Title decreased thoracic and lumbar spine pain by >= 80% with ADLs    Status Achieved      PT LONG TERM GOAL #2   Title Patient able to return to his normal workout and basketball activity without limitations from back pain.    Baseline has not attempted yet. Plans to go today.    Status Deferred      PT LONG TERM GOAL #3   Title Patient indendent with HEP to maintain therapy gains.    Status Achieved                   Plan - 08/01/20 1454     Clinical Impression Statement Patient presents today without  pain in his thoracic spine. He states he is only feeling a pull in the area where he had pain and that he has been able to travel without having pain on the airplaine. Patient has met or partially met most goals and patient would like to be put on hold for 4 weeks in case of a flare up. We finalized his HEP and he also was educated in self MFR to the thoracic paraspinals. He had increased soreness and tone in right thoracic paraspinals at end of session so estim/heat was administered. Pt planning to get a TENs unit. He plans to return to his previous workouts and basketball gradually.    PT Frequency 2x / week    PT Duration 8 weeks    PT Treatment/Interventions ADLs/Self Care Home Management;Cryotherapy;Electrical Stimulation;Moist Heat;Therapeutic exercise;Therapeutic activities;Neuromuscular re-education;Manual techniques;Dry needling;Patient/family education;Taping;Spinal Manipulations    PT Next Visit Plan Hold pt until 08/29/20 then D/C if pt has not returned.    PT Home Exercise Plan [email or web address]    Consulted and Agree with Plan of Care Patient             Patient will benefit from skilled therapeutic intervention in order to improve the following deficits and impairments:  Increased muscle spasms,Pain,Hypomobility,Impaired flexibility,Decreased strength,Decreased activity tolerance  Visit Diagnosis: Muscle spasm of back  Pain in thoracic spine     Problem List Patient Active Problem List   Diagnosis Date Noted   GERD (gastroesophageal reflux disease) 04/23/2020    Madelyn Flavors PT 08/01/2020, 3:00 PM PHYSICAL THERAPY DISCHARGE SUMMARY  Visits from Start of Care: 6  Current functional level related to goals / functional outcomes: See above for current PT status.  Pt didn't return to PT.     Remaining deficits: See above for most current PT status.     Education / Equipment: HEP, posture/mechanics    Patient agrees to discharge. Patient goals were partially met. Patient is  being discharged due to being pleased with the current functional level.  Sigurd Sos, PT 12/08/20 7:48 PM   [facility] [address]  [place], [place], [place], [address] Phone: [phone]   Fax:  [phone]  Name: Reef Achterberg MRN: [id number] Date of Birth: [birth year]

## 2020-08-01 NOTE — Patient Instructions (Signed)
Access Code: [email or web address] URL: [email or web address] Date: 08/01/2020 Prepared by: Raynelle Fanning  Exercises Sidelying Thoracic Rotation with Open Book - 1 x daily - 7 x weekly - 2 sets - 5 reps Supine Hamstring Stretch - 1 x daily - 7 x weekly - 1 sets - 2 reps - 30 sec hold Supine Piriformis Stretch with Leg Straight - 1 x daily - 7 x weekly - 1 sets - 2 reps - 30 sec hold Supine Active Straight Leg Raise - 1 x daily - 7 x weekly - 1 sets - 10 reps Cat-Camel - 2 x daily - 7 x weekly - 10 reps - 1 sets - 5 hold Child's Pose Stretch - 1 x daily - 7 x weekly - 1 sets - 3 reps - 20 hold Child's Pose with Sidebending - 1 x daily - 7 x weekly - 1 sets - 3 reps - 20 hold Supine Shoulder Horizontal Abduction with Resistance - 2 x daily - 7 x weekly - 10 reps - 2 sets Supine Bilateral Shoulder External Rotation with Resistance - 2 x daily - 7 x weekly - 10 reps - 2 sets Supine PNF D2 Flexion with Resistance - 2 x daily - 7 x weekly - 10 reps - 2 sets Marathon Oil with Resistance - 1 x daily - 7 x weekly - 3 sets - 10 reps Standing Thoracic Paraspinal Massage with Foam Roller - 1 x daily - 7 x weekly - 3 sets - 10 reps  Patient Education Trigger Point Dry Needling TENS Unit

## 2020-08-11 ENCOUNTER — Ambulatory Visit (HOSPITAL_BASED_OUTPATIENT_CLINIC_OR_DEPARTMENT_OTHER): Payer: PRIVATE HEALTH INSURANCE | Attending: Family Medicine | Admitting: Internal Medicine

## 2020-08-11 ENCOUNTER — Other Ambulatory Visit: Payer: Self-pay

## 2020-08-11 VITALS — Ht 66.0 in | Wt 205.0 lb

## 2020-08-11 DIAGNOSIS — G4733 Obstructive sleep apnea (adult) (pediatric): Secondary | ICD-10-CM

## 2020-08-11 DIAGNOSIS — G4730 Sleep apnea, unspecified: Secondary | ICD-10-CM | POA: Diagnosis present

## 2020-08-17 DIAGNOSIS — G4730 Sleep apnea, unspecified: Secondary | ICD-10-CM | POA: Diagnosis not present

## 2020-08-17 NOTE — Procedures (Signed)
    Patient Name: Martin, Arnold Date: 08/11/2020 Gender: Male D.O.B: 1990/08/04 Age (years): 29 Referring Provider: Betty G Swaziland Height (inches): 66 Interpreting Physician: Jetty Duhamel MD, ABSM Weight (lbs): 205 RPSGT: Rosette Reveal BMI: 33 MRN: [id number] Neck Size: 17.00  CLINICAL INFORMATION Sleep Study Type: NPSG Indication for sleep study: OSA Epworth Sleepiness Score: 10  SLEEP STUDY TECHNIQUE As per the AASM Manual for the Scoring of Sleep and Associated Events v2.3 (April 2016) with a hypopnea requiring 4% desaturations.  The channels recorded and monitored were frontal, central and occipital EEG, electrooculogram (EOG), submentalis EMG (chin), nasal and oral airflow, thoracic and abdominal wall motion, anterior tibialis EMG, snore microphone, electrocardiogram, and pulse oximetry.  MEDICATIONS Medications self-administered by patient taken the night of the study : none reported  SLEEP ARCHITECTURE The study was initiated at 10:28:32 PM and ended at 4:36:13 AM.  Sleep onset time was 40.9 minutes and the sleep efficiency was 84.4%%. The total sleep time was 310.5 minutes.  Stage REM latency was 63.0 minutes.  The patient spent 5.5%% of the night in stage N1 sleep, 67.6%% in stage N2 sleep, 0.0%% in stage N3 and 26.9% in REM.  Alpha intrusion was absent.  Supine sleep was 37.83%.  RESPIRATORY PARAMETERS The overall apnea/hypopnea index (AHI) was 13.5 per hour. There were 4 total apneas, including 4 obstructive, 0 central and 0 mixed apneas. There were 66 hypopneas and 37 RERAs.  The AHI during Stage REM sleep was 32.3 per hour.  AHI while supine was 25.5 per hour.  The mean oxygen saturation was 93.4%. The minimum SpO2 during sleep was 74.0%.  loud snoring was noted during this study.  CARDIAC DATA The 2 lead EKG demonstrated sinus rhythm. The mean heart rate was 64.1 beats per minute. Other EKG findings include: None.  LEG MOVEMENT DATA The  total PLMS were 0 with a resulting PLMS index of 0.0. Associated arousal with leg movement index was 0.0 .  IMPRESSIONS - Mild obstructive sleep apnea occurred during this study (AHI = 13.5/h). - Moderate oxygen desaturation was noted during this study (Min O2 = 74.0%). Mean O2 saturation 93.4%. - The patient snored with loud snoring volume. - No cardiac abnormalities were noted during this study. - Clinically significant periodic limb movements did not occur during sleep. No significant associated arousals  DIAGNOSIS - Obstructive Sleep Apnea (G47.33)  RECOMMENDATIONS - Suggest CPAP titration sleep study or autopap 5-20. Other options would be based on clinical judgment. - Be careful with alcohol, sedatives and other CNS depressants that may worsen sleep apnea and disrupt normal sleep architecture. - Sleep hygiene should be reviewed to assess factors that may improve sleep quality. - Weight management and regular exercise should be initiated or continued if appropriate. - Consultation is available.  [Electronically signed] 08/17/2020 10:41 AM  Jetty Duhamel MD, ABSM Diplomate, American Board of Sleep Medicine   NPI: [id number]                         Jetty Duhamel Diplomate, American Board of Sleep Medicine  ELECTRONICALLY SIGNED ON:  08/17/2020, 10:37 AM [facility] PH: [phone]   FX: [phone] ACCREDITED BY THE AMERICAN ACADEMY OF SLEEP MEDICINE

## 2020-08-28 ENCOUNTER — Other Ambulatory Visit: Payer: Self-pay

## 2020-08-28 DIAGNOSIS — G4733 Obstructive sleep apnea (adult) (pediatric): Secondary | ICD-10-CM

## 2020-11-14 NOTE — Progress Notes (Signed)
11/15/20- 30 yoM never smoker for sleep evaluation courtesy of Dr Betty [place] with concern of OSA Medical problem list includes  OSA, GERD,  NPSG 08/11/20- AHI 13.5/ htr, desaturation to 74%, body weight 205 lbs. Previously eval by Dr Jerre Simon for disruptive snoring in 2019. Epworth score-11 Body weight today-209 lbs Covid vax2 [employer] Girlfriend tells him he snores. Wakes gasping. Stays tired but naps not refreshing. Family members use CPAP. 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 No sleep meds. Little caffeine. Occ kicks in sleep, otw no parasomnias. ENT surgery+tosillectomy as child. Denies heart/ lung probs. Estimates 7-8 hrs sleep from around MN to 7AM with little WASO.  Prior to Admission medications   Medication Sig Start Date End Date Taking? Authorizing Provider  esomeprazole (NEXIUM) 20 MG capsule Take 1 capsule (20 mg total) by mouth daily at 12 noon. 04/23/20  Yes [place], Betty G, MD  naproxen (NAPROSYN) 375 MG tablet Take 1 tablet (375 mg total) by mouth 2 (two) times daily. 04/05/20  Yes Bing Neighbors, FNP  cyclobenzaprine (FLEXERIL) 5 MG tablet Take 2 tablets (10 mg total) by mouth 2 (two) times daily as needed for muscle spasms. Patient not taking: Reported on 11/15/2020 04/05/20   Bing Neighbors, FNP   No past medical history on file. Past Surgical History:  Procedure Laterality Date   FINGER SURGERY     HERNIA REPAIR     TONSILLECTOMY     Family History  Problem Relation Age of Onset   Hypertension Mother    Prostate cancer Father    Social History   Socioeconomic History   Marital status: Single    Spouse name: Not on file   Number of children: Not on file   Years of education: Not on file   Highest education level: Not on file  Occupational History   Not on file  Tobacco Use   Smoking status: Never   Smokeless tobacco: Never  Vaping Use   Vaping Use: Never used  Substance and Sexual Activity   Alcohol use: Yes    Comment: occ   Drug use: Not  Currently   Sexual activity: Not on file  Other Topics Concern   Not on file  Social History Narrative   Not on file   Social Determinants of Health   Financial Resource Strain: Not on file  Food Insecurity: Not on file  Transportation Needs: Not on file  Physical Activity: Not on file  Stress: Not on file  Social Connections: Not on file  Intimate Partner Violence: Not on file   ROS-see HPI   + = positive Constitutional:    weight loss, night sweats, fevers, chills, +fatigue, lassitude. HEENT:    headaches, difficulty swallowing, tooth/dental problems, sore throat,       sneezing, itching, ear ache, nasal congestion, post nasal drip, snoring CV:    chest pain, orthopnea, PND, swelling in lower extremities, anasarca, dizziness, palpitations Resp:   shortness of breath with exertion or at rest.                productive cough,   non-productive cough, coughing up of blood.              change in color of mucus.  wheezing.   Skin:    rash or lesions. GI:  No-   heartburn, indigestion, abdominal pain, nausea, vomiting, diarrhea,                 change in bowel habits,  loss of appetite GU: dysuria, change in color of urine, no urgency or frequency.   flank pain. MS:   joint pain, stiffness, decreased range of motion, back pain. Neuro-     nothing unusual Psych:  change in mood or affect.  depression or anxiety.   memory loss.  OBJ- Physical Exam General- Alert, Oriented, Affect-appropriate, Distress- none acute, + muscular Skin- rash-none, lesions- none, excoriation- none, + tatoos Lymphadenopathy- none Head- atraumatic            Eyes- Gross vision intact, PERRLA, conjunctivae and secretions clear            Ears- Hearing, canals-normal            Nose- Clear, no-Septal dev, mucus, polyps, erosion, perforation             Throat- Mallampati IV, mucosa clear , drainage- none, tonsils+residual,  + teeth,  Neck- flexible , trachea midline, no stridor , thyroid nl, carotid no  bruit Chest - symmetrical excursion , unlabored           Heart/CV- RRR , no murmur , no gallop  , no rub, nl s1 s2                           - JVD- none , edema- none, stasis changes- none, varices- none           Lung- clear to P&A, wheeze- none, cough- none , dullness-none, rub- none           Chest wall-  Abd-  Br/ Gen/ Rectal- Not done, not indicated Extrem- cyanosis- none, clubbing, none, atrophy- none, strength- nl Neuro- grossly intact to observation

## 2020-11-15 ENCOUNTER — Other Ambulatory Visit: Payer: Self-pay

## 2020-11-15 ENCOUNTER — Encounter: Payer: Self-pay | Admitting: Internal Medicine

## 2020-11-15 ENCOUNTER — Ambulatory Visit (INDEPENDENT_AMBULATORY_CARE_PROVIDER_SITE_OTHER): Payer: PRIVATE HEALTH INSURANCE | Admitting: Internal Medicine

## 2020-11-15 DIAGNOSIS — G4733 Obstructive sleep apnea (adult) (pediatric): Secondary | ICD-10-CM | POA: Diagnosis not present

## 2020-11-15 DIAGNOSIS — K219 Gastro-esophageal reflux disease without esophagitis: Secondary | ICD-10-CM | POA: Diagnosis not present

## 2020-11-15 NOTE — Assessment & Plan Note (Signed)
Continue reflux precautions 

## 2020-11-15 NOTE — Assessment & Plan Note (Signed)
Discussed results of sleep study, physiology, medical issues, driving responsibility, treatment options. His focus is on relieving daytime fatigue. He is interested in CPAP and oral appliance options. Plan- We will refer for both CPAP and Oral appliance consideration so he can explore these and choose a path.

## 2020-11-15 NOTE — Patient Instructions (Signed)
Order- new DME, new CPAP auto 5-20, mask of choice, humidifier, supplies, Airview/ card    Doy Mince machine ok  Order- referral to Dr Myrtis Ser Orthodontist   consider oral appliance for OSA  Please call if we can help

## 2021-03-03 NOTE — Progress Notes (Deleted)
11/15/20- 30 yoM never smoker for sleep evaluation courtesy of Dr Betty [place] with concern of OSA Medical problem list includes  OSA, GERD,  NPSG 08/11/20- AHI 13.5/ htr, desaturation to 74%, body weight 205 lbs. Previously eval by Dr Jerre Simon for disruptive snoring in 2019. Epworth score-11 Body weight today-209 lbs Covid vax2 [employer] Girlfriend tells him he snores. Wakes gasping. Stays tired but naps not refreshing. Family members use CPAP. 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 No sleep meds. Little caffeine. Occ kicks in sleep, otw no parasomnias. ENT surgery+tosillectomy as child. Denies heart/ lung probs. Estimates 7-8 hrs sleep from around [place] to 7AM with little WASO.  03/04/21- 30 yoM never smoker followed for OSA, complicated by GERD,  We had referred for both OAP and CPAP so he could decide.  Body weight today- Covid vax- Flu vax-    ROS-see HPI   + = positive Constitutional:    weight loss, night sweats, fevers, chills, +fatigue, lassitude. HEENT:    headaches, difficulty swallowing, tooth/dental problems, sore throat,       sneezing, itching, ear ache, nasal congestion, post nasal drip, snoring CV:    chest pain, orthopnea, PND, swelling in lower extremities, anasarca, dizziness, palpitations Resp:   shortness of breath with exertion or at rest.                productive cough,   non-productive cough, coughing up of blood.              change in color of mucus.  wheezing.   Skin:    rash or lesions. GI:  No-   heartburn, indigestion, abdominal pain, nausea, vomiting, diarrhea,                 change in bowel habits, loss of appetite GU: dysuria, change in color of urine, no urgency or frequency.   flank pain. MS:   joint pain, stiffness, decreased range of motion, back pain. Neuro-     nothing unusual Psych:  change in mood or affect.  depression or anxiety.   memory loss.  OBJ- Physical Exam General- Alert, Oriented, Affect-appropriate, Distress- none acute, +  muscular Skin- rash-none, lesions- none, excoriation- none, + tatoos Lymphadenopathy- none Head- atraumatic            Eyes- Gross vision intact, PERRLA, conjunctivae and secretions clear            Ears- Hearing, canals-normal            Nose- Clear, no-Septal dev, mucus, polyps, erosion, perforation             Throat- Mallampati IV, mucosa clear , drainage- none, tonsils+residual,  + teeth,  Neck- flexible , trachea midline, no stridor , thyroid nl, carotid no bruit Chest - symmetrical excursion , unlabored           Heart/CV- RRR , no murmur , no gallop  , no rub, nl s1 s2                           - JVD- none , edema- none, stasis changes- none, varices- none           Lung- clear to P&A, wheeze- none, cough- none , dullness-none, rub- none           Chest wall-  Abd-  Br/ Gen/ Rectal- Not done, not indicated Extrem- cyanosis- none, clubbing, none, atrophy- none, strength- nl Neuro- grossly intact  to observation

## 2021-03-04 ENCOUNTER — Ambulatory Visit: Payer: PRIVATE HEALTH INSURANCE | Admitting: Internal Medicine

## 2021-03-11 ENCOUNTER — Telehealth: Payer: Self-pay | Admitting: Internal Medicine

## 2021-03-11 DIAGNOSIS — G4733 Obstructive sleep apnea (adult) (pediatric): Secondary | ICD-10-CM

## 2021-03-11 NOTE — Telephone Encounter (Signed)
Pt states he missed the f/u with CY on 10/4.Pt states he hasn't gotten a cpap machine yet. I didn't see where we sent an order to a DME company. Not sure if pt f/u with Dr Katz-didn't see a referral. Pt just wanting to know how to get machine. Please advise [phone]

## 2021-03-11 NOTE — Telephone Encounter (Signed)
Call made to patient, confirmed DOB. Patient is wanting to know when he will get his cpap. I made him aware there is a nation delay and we are not able at this time to give him a time. Patient requesting the order be mailed to him so he can purchase out of pocket. Order printed.   Nothing further needed at this time.
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
## 2022-01-12 ENCOUNTER — Telehealth: Payer: Self-pay | Admitting: Internal Medicine

## 2022-01-12 NOTE — Telephone Encounter (Signed)
Called and left voicemail with Brad from [employer] about CPAP order from last year. Waiting for call back

## 2022-10-16 NOTE — Progress Notes (Signed)
HPI: [name] is a 32 y.o.male with PMHx significant for OSA and GERD here today for his routine physical examination.  Last CPE: 04/23/2020  He mentions that he has been trying to improve his lifestyle by exercising more regularly. However, he has been experiencing significant knee pain since January, which has severely limited his physical activity.   He also discusses his dietary habits, noting that he is making an effort to eat healthily, including avoiding bread with meals and focusing on low-fat options. He sleeps about five to six hours per night and denies smoking or excessive alcohol consumption.  Immunization History  Administered Date(s) Administered   PFIZER(Purple Top)SARS-COV-2 Vaccination 02/15/2020, 03/09/2020   Tdap 08/25/2017   Health Maintenance  Topic Date Due   Hepatitis C Screening  Never done   COVID-19 Vaccine (3 - 2023-24 season) 11/04/2022 (Originally 01/30/2022)   INFLUENZA VACCINE  12/31/2022   DTaP/Tdap/Td (2 - Td or Tdap) 08/26/2027   HIV Screening  Completed   HPV VACCINES  Aged Out   He expresses concern about his family history of heart disease. He reports that heart disease is prevalent on his father's side, uncles and cousins, with many relatives having suffered heart attacks or heart disease, some even before the age of 40.  HLD: Mild. He is not on pharmacologic treatment.  Lab Results  Component Value Date   CHOL 183 04/23/2020   HDL 51 04/23/2020   LDLCALC 116 (H) 04/23/2020   TRIG 70 04/23/2020   CHOLHDL 3.6 04/23/2020   Knee pain worsens with movement and certain activities such as walking up and down stairs, and improves temporarily with rest. He describes the pain as severe enough to require holding onto a rail for support when navigating stairs. No hx of recent trauma. He reports past right knee injury when playing basketball. He has not noted erythema or effusion.  He has a history of sleep apnea diagnosed in 2022 and has  not used a CPAP machine for management.  Review of Systems  Constitutional:  Positive for fatigue. Negative for activity change, appetite change and fever.  HENT:  Negative for nosebleeds, sore throat and trouble swallowing.   Eyes:  Negative for redness and visual disturbance.  Respiratory:  Negative for cough, shortness of breath and wheezing.   Cardiovascular:  Negative for chest pain, palpitations and leg swelling.  Gastrointestinal:  Negative for abdominal pain, blood in stool, nausea and vomiting.  Endocrine: Negative for cold intolerance, heat intolerance, polydipsia, polyphagia and polyuria.  Genitourinary:  Negative for decreased urine volume, dysuria, genital sores, hematuria and testicular pain.  Musculoskeletal:  Positive for arthralgias. Negative for myalgias.  Skin:  Negative for color change and rash.  Allergic/Immunologic: Negative for environmental allergies.  Neurological:  Negative for seizures, syncope, weakness and headaches.  Hematological:  Negative for adenopathy. Does not bruise/bleed easily.  Psychiatric/Behavioral:  Negative for confusion. The patient is not nervous/anxious.   All other systems reviewed and are negative.  No current outpatient medications on file prior to visit.   No current facility-administered medications on file prior to visit.   History reviewed. No pertinent past medical history.  Past Surgical History:  Procedure Laterality Date   FINGER SURGERY     HERNIA REPAIR     TONSILLECTOMY     No Known Allergies  Family History  Problem Relation Age of Onset   Hypertension Mother    Prostate cancer Father    Social History   Socioeconomic History   Marital  status: Single    Spouse name: Not on file   Number of children: Not on file   Years of education: Not on file   Highest education level: Not on file  Occupational History   Not on file  Tobacco Use   Smoking status: Never   Smokeless tobacco: Never  Vaping Use   Vaping  Use: Never used  Substance and Sexual Activity   Alcohol use: Yes    Comment: occ   Drug use: Not Currently   Sexual activity: Not on file  Other Topics Concern   Not on file  Social History Narrative   Not on file   Social Determinants of Health   Financial Resource Strain: Not on file  Food Insecurity: Not on file  Transportation Needs: Not on file  Physical Activity: Not on file  Stress: Not on file  Social Connections: Not on file   Vitals:   10/19/22 0930  BP: 110/70  Pulse: 70  Resp: 12  Temp: 98 F (36.7 C)  SpO2: 98%  Body mass index is 33.84 kg/m. Wt Readings from Last 3 Encounters:  10/19/22 206 lb 8 oz (93.7 kg)  11/15/20 209 lb (94.8 kg)  08/11/20 205 lb (93 kg)   Physical Exam Vitals and nursing note reviewed.  Constitutional:      General: He is not in acute distress.    Appearance: He is well-developed.  HENT:     Head: Normocephalic and atraumatic.     Right Ear: Tympanic membrane, ear canal and external ear normal.     Left Ear: Tympanic membrane, ear canal and external ear normal.     Mouth/Throat:     Mouth: Mucous membranes are moist.     Pharynx: Oropharynx is clear.  Eyes:     Extraocular Movements: Extraocular movements intact.     Conjunctiva/sclera: Conjunctivae normal.     Pupils: Pupils are equal, round, and reactive to light.  Neck:     Thyroid: No thyromegaly.     Trachea: No tracheal deviation.  Cardiovascular:     Rate and Rhythm: Normal rate and regular rhythm.     Pulses:          Dorsalis pedis pulses are 2+ on the right side and 2+ on the left side.     Heart sounds: No murmur heard. Pulmonary:     Effort: Pulmonary effort is normal. No respiratory distress.     Breath sounds: Normal breath sounds.  Abdominal:     Palpations: Abdomen is soft. There is no hepatomegaly or mass.     Tenderness: There is no abdominal tenderness.  Genitourinary:    Comments: No concerns. Musculoskeletal:     Cervical back: Normal range  of motion.     Left knee: Crepitus present. Tenderness present over the medial joint line.     Comments: No signs of synovitis.  Lymphadenopathy:     Cervical: No cervical adenopathy.     Upper Body:     Right upper body: No supraclavicular adenopathy.     Left upper body: No supraclavicular adenopathy.  Skin:    General: Skin is warm.     Findings: No erythema.  Neurological:     General: No focal deficit present.     Mental Status: He is alert and oriented to person, place, and time.     Cranial Nerves: No cranial nerve deficit.     Sensory: No sensory deficit.     Gait: Gait normal.  Deep Tendon Reflexes:     Reflex Scores:      Bicep reflexes are 2+ on the right side and 2+ on the left side.      Patellar reflexes are 2+ on the right side and 2+ on the left side. Psychiatric:        Mood and Affect: Affect normal. Mood is anxious.   ASSESSMENT AND PLAN:  Mr. Ava was seen today for annual exam.  Diagnoses and all orders for this visit: Lab Results  Component Value Date   CREATININE 0.88 10/19/2022   BUN 10 10/19/2022   NA 139 10/19/2022   K 3.9 10/19/2022   CL 102 10/19/2022   CO2 27 10/19/2022   Lab Results  Component Value Date   ALT 20 10/19/2022   AST 17 10/19/2022   ALKPHOS 47 10/19/2022   BILITOT 0.8 10/19/2022   Lab Results  Component Value Date   CHOL 158 10/19/2022   HDL 53.10 10/19/2022   LDLCALC 92 10/19/2022   TRIG 66.0 10/19/2022   CHOLHDL 3 10/19/2022   Routine general medical examination at a health care facility Assessment & Plan: We discussed the importance of regular physical activity and healthy diet for prevention of chronic illness and/or complications. Preventive guidelines reviewed. Vaccination up to date. Next CPE in a year.   Encounter for HCV screening test for low risk patient -     Hepatitis C antibody; Future  Screening for endocrine, metabolic and immunity disorder -     Comprehensive metabolic panel;  Future  Hyperlipidemia, unspecified hyperlipidemia type Assessment & Plan: Non pharmacologic treatment recommended for now. Further recommendations will be given according to lipid panel numbers.  Orders: -     Lipid panel; Future  OSA (obstructive sleep apnea) Assessment & Plan: He is not on CPAP. We briefly discussed possible complications if not adequately treated. Sleep study in 2022. He does not thinks he needs a new referral to see Dr. Maple Hudson, he will let me know if he does.   Pain in both knees, unspecified chronicity Assessment & Plan: Since 06/2022, gradually getting worse. ? Patellofemoral synd. It is affecting daily activities. Referral to sport medicine placed.  Orders: -     Ambulatory referral to Sports Medicine  Return in 1 year (on 10/19/2023) for CPE, Labs.  Jerie Basford G. [place], MD  [facility]. [place] office.

## 2022-10-19 ENCOUNTER — Encounter: Payer: Self-pay | Admitting: Family Medicine

## 2022-10-19 ENCOUNTER — Ambulatory Visit (INDEPENDENT_AMBULATORY_CARE_PROVIDER_SITE_OTHER): Payer: PRIVATE HEALTH INSURANCE | Admitting: Family Medicine

## 2022-10-19 VITALS — BP 110/70 | HR 70 | Temp 98.0°F | Resp 12 | Ht 65.5 in | Wt 206.5 lb

## 2022-10-19 DIAGNOSIS — Z0000 Encounter for general adult medical examination without abnormal findings: Secondary | ICD-10-CM | POA: Diagnosis not present

## 2022-10-19 DIAGNOSIS — Z13228 Encounter for screening for other metabolic disorders: Secondary | ICD-10-CM

## 2022-10-19 DIAGNOSIS — M25562 Pain in left knee: Secondary | ICD-10-CM

## 2022-10-19 DIAGNOSIS — E785 Hyperlipidemia, unspecified: Secondary | ICD-10-CM

## 2022-10-19 DIAGNOSIS — Z1159 Encounter for screening for other viral diseases: Secondary | ICD-10-CM | POA: Diagnosis not present

## 2022-10-19 DIAGNOSIS — M25561 Pain in right knee: Secondary | ICD-10-CM

## 2022-10-19 DIAGNOSIS — G4733 Obstructive sleep apnea (adult) (pediatric): Secondary | ICD-10-CM

## 2022-10-19 DIAGNOSIS — Z130 Encounter for screening for diseases of the blood and blood-forming organs and certain disorders involving the immune mechanism: Secondary | ICD-10-CM | POA: Diagnosis not present

## 2022-10-19 DIAGNOSIS — Z1329 Encounter for screening for other suspected endocrine disorder: Secondary | ICD-10-CM | POA: Diagnosis not present

## 2022-10-19 LAB — COMPREHENSIVE METABOLIC PANEL WITH GFR
ALT: 20 U/L (ref 0–53)
AST: 17 U/L (ref 0–37)
Albumin: 4.5 g/dL (ref 3.5–5.2)
Alkaline Phosphatase: 47 U/L (ref 39–117)
BUN: 10 mg/dL (ref 6–23)
CO2: 27 meq/L (ref 19–32)
Calcium: 10 mg/dL (ref 8.4–10.5)
Chloride: 102 meq/L (ref 96–112)
Creatinine, Ser: 0.88 mg/dL (ref 0.40–1.50)
GFR: 112.45 mL/min
Glucose, Bld: 89 mg/dL (ref 70–99)
Potassium: 3.9 meq/L (ref 3.5–5.1)
Sodium: 139 meq/L (ref 135–145)
Total Bilirubin: 0.8 mg/dL (ref 0.2–1.2)
Total Protein: 7.6 g/dL (ref 6.0–8.3)

## 2022-10-19 LAB — LIPID PANEL
Cholesterol: 158 mg/dL (ref 0–200)
HDL: 53.1 mg/dL
LDL Cholesterol: 92 mg/dL (ref 0–99)
NonHDL: 104.87
Total CHOL/HDL Ratio: 3
Triglycerides: 66 mg/dL (ref 0.0–149.0)
VLDL: 13.2 mg/dL (ref 0.0–40.0)

## 2022-10-19 NOTE — Assessment & Plan Note (Signed)
We discussed the importance of regular physical activity and healthy diet for prevention of chronic illness and/or complications. Preventive guidelines reviewed. Vaccination up-to-date. Next CPE in a year. 

## 2022-10-19 NOTE — Assessment & Plan Note (Signed)
Non pharmacologic treatment recommended for now. Further recommendations will be given according to lipid panel numbers.  

## 2022-10-19 NOTE — Assessment & Plan Note (Signed)
He is not on CPAP. We briefly discussed possible complications if not adequately treated. Sleep study in 2022. He does not thinks he needs a new referral to see Dr. Maple Hudson, he will let me know if he does.

## 2022-10-19 NOTE — Assessment & Plan Note (Signed)
Since 06/2022, gradually getting worse. ? Patellofemoral synd. It is affecting daily activities. Referral to sport medicine placed.

## 2022-10-19 NOTE — Patient Instructions (Addendum)
A few things to remember from today's visit:  Routine general medical examination at a health care facility  Encounter for HCV screening test for low risk patient - Plan: Hepatitis C antibody  Screening for endocrine, metabolic and immunity disorder - Plan: Comprehensive metabolic panel  Hyperlipidemia, unspecified hyperlipidemia type - Plan: Lipid panel  OSA (obstructive sleep apnea)  Pain in both knees, unspecified chronicity - Plan: Ambulatory referral to Sports Medicine  Let me know if a new referral to pulmonologist is needed.  Do not use My Chart to request refills or for acute issues that need immediate attention. If you send a my chart message, it may take a few days to be addressed, specially if I am not in the office.  Please be sure medication list is accurate. If a new problem present, please set up appointment sooner than planned today.  Health Maintenance, Male Adopting a healthy lifestyle and getting preventive care are important in promoting health and wellness. Ask your health care provider about: The right schedule for you to have regular tests and exams. Things you can do on your own to prevent diseases and keep yourself healthy. What should I know about diet, weight, and exercise? Eat a healthy diet  Eat a diet that includes plenty of vegetables, fruits, low-fat dairy products, and lean protein. Do not eat a lot of foods that are high in solid fats, added sugars, or sodium. Maintain a healthy weight Body mass index (BMI) is a measurement that can be used to identify possible weight problems. It estimates body fat based on height and weight. Your health care provider can help determine your BMI and help you achieve or maintain a healthy weight. Get regular exercise Get regular exercise. This is one of the most important things you can do for your health. Most adults should: Exercise for at least 150 minutes each week. The exercise should increase your heart rate  and make you sweat (moderate-intensity exercise). Do strengthening exercises at least twice a week. This is in addition to the moderate-intensity exercise. Spend less time sitting. Even light physical activity can be beneficial. Watch cholesterol and blood lipids Have your blood tested for lipids and cholesterol at 34 years of age, then have this test every 5 years. You may need to have your cholesterol levels checked more often if: Your lipid or cholesterol levels are high. You are older than 34 years of age. You are at high risk for heart disease. What should I know about cancer screening? Many types of cancers can be detected early and may often be prevented. Depending on your health history and family history, you may need to have cancer screening at various ages. This may include screening for: Colorectal cancer. Prostate cancer. Skin cancer. Lung cancer. What should I know about heart disease, diabetes, and high blood pressure? Blood pressure and heart disease High blood pressure causes heart disease and increases the risk of stroke. This is more likely to develop in people who have high blood pressure readings or are overweight. Talk with your health care provider about your target blood pressure readings. Have your blood pressure checked: Every 3-5 years if you are 30-74 years of age. Every year if you are 55 years old or older. If you are between the ages of 63 and 80 and are a current or former smoker, ask your health care provider if you should have a one-time screening for abdominal aortic aneurysm (AAA). Diabetes Have regular diabetes screenings. This checks your fasting  blood sugar level. Have the screening done: Once every three years after age 53 if you are at a normal weight and have a low risk for diabetes. More often and at a younger age if you are overweight or have a high risk for diabetes. What should I know about preventing infection? Hepatitis B If you have a  higher risk for hepatitis B, you should be screened for this virus. Talk with your health care provider to find out if you are at risk for hepatitis B infection. Hepatitis C Blood testing is recommended for: Everyone born from [birth year] through 1967. Anyone with known risk factors for hepatitis C. Sexually transmitted infections (STIs) You should be screened each year for STIs, including gonorrhea and chlamydia, if: You are sexually active and are younger than 34 years of age. You are older than 34 years of age and your health care provider tells you that you are at risk for this type of infection. Your sexual activity has changed since you were last screened, and you are at increased risk for chlamydia or gonorrhea. Ask your health care provider if you are at risk. Ask your health care provider about whether you are at high risk for HIV. Your health care provider may recommend a prescription medicine to help prevent HIV infection. If you choose to take medicine to prevent HIV, you should first get tested for HIV. You should then be tested every 3 months for as long as you are taking the medicine. Follow these instructions at home: Alcohol use Do not drink alcohol if your health care provider tells you not to drink. If you drink alcohol: Limit how much you have to 0-2 drinks a day. Know how much alcohol is in your drink. In the [place] one drink equals one 12 oz bottle of beer (355 mL), one 5 oz glass of wine (148 mL), or one 1 oz glass of hard liquor (44 mL). Lifestyle Do not use any products that contain nicotine or tobacco. These products include cigarettes, chewing tobacco, and vaping devices, such as e-cigarettes. If you need help quitting, ask your health care provider. Do not use street drugs. Do not share needles. Ask your health care provider for help if you need support or information about quitting drugs. General instructions Schedule regular health, dental, and eye exams. Stay current  with your vaccines. Tell your health care provider if: You often feel depressed. You have ever been abused or do not feel safe at home. Summary Adopting a healthy lifestyle and getting preventive care are important in promoting health and wellness. Follow your health care provider's instructions about healthy diet, exercising, and getting tested or screened for diseases. Follow your health care provider's instructions on monitoring your cholesterol and blood pressure. This information is not intended to replace advice given to you by your health care provider. Make sure you discuss any questions you have with your health care provider. Document Revised: 10/07/2020 Document Reviewed: 10/07/2020 Elsevier Patient Education  2023 [employer].

## 2022-10-20 LAB — HEPATITIS C ANTIBODY: Hepatitis C Ab: NONREACTIVE

## 2022-11-09 ENCOUNTER — Telehealth: Payer: Self-pay | Admitting: Internal Medicine

## 2022-11-09 NOTE — Telephone Encounter (Signed)
PT has a sleep study done w/Dr. Jeannie Fend already. A Cpap was ordered but it was not covered under his ins. So he let it go.   Can we put a new order in now or does he need an new appt? Please call PT to advise. [phone]

## 2022-11-13 NOTE — Telephone Encounter (Signed)
Pt called back and stated he is not in network with Dr. Maple Hudson but is in network with Dr. Wynona Neat. Pt wants to know if he can get started with his CPAP machine by getting one himself

## 2022-11-13 NOTE — Telephone Encounter (Signed)
Called and spoke with patient. I advised him since he hadn't been seen in 2 years he would need an OV to start over again. He wasn't sure if our office was in network with his insurance. When he had his first OV, he found out we were not in network and he had to pay out of pocket for both the sleep study and OV. I advised him to call his insurance to see if we are out of network and if we are, to offer suggestions of who is in network since we do not have this information. He verbalized understanding.   Once he has this information, he will call us back to let us know. Will keep encounter open for follow up.

## 2022-11-26 NOTE — Telephone Encounter (Signed)
May I close this encounter? Thank you.

## 2023-01-19 ENCOUNTER — Encounter: Payer: Self-pay | Admitting: Pulmonary Disease

## 2023-01-19 ENCOUNTER — Ambulatory Visit (INDEPENDENT_AMBULATORY_CARE_PROVIDER_SITE_OTHER): Payer: PRIVATE HEALTH INSURANCE | Admitting: Pulmonary Disease

## 2023-01-19 VITALS — BP 122/82 | HR 71 | Ht 65.0 in | Wt 203.8 lb

## 2023-01-19 DIAGNOSIS — G4733 Obstructive sleep apnea (adult) (pediatric): Secondary | ICD-10-CM | POA: Diagnosis not present

## 2023-01-19 NOTE — Progress Notes (Signed)
Martin Arnold    [id number]    Mar 23, 1991  Primary Care Physician:Jordan, Timoteo Expose, MD  Referring Physician: [place], Betty G, MD [address] [place],  [place] [address]  Chief complaint:   Patient with a history of obstructive sleep apnea  HPI:  Was unable to start CPAP previously secondary to insurance changes  Continues to have similar symptoms  Snoring, multiple awakenings Usually goes to bed between 1130 and 730 No significant dryness of his mouth in the morning No morning headaches Rare night sweats  Never smoker  Does not take daytime naps  Brother does have obstructive sleep apnea  Takes omeprazole for reflux  Last sleep study was in 2022, August 11, 2020 showing AHI of 13.5.  CPAP was recommended at the time  Patient stated he paid a lot of money to get the study done and was not aware that he will have to repeat the study when he was told that last study was over 2 years ago  No outpatient encounter medications on file as of 01/19/2023.   No facility-administered encounter medications on file as of 01/19/2023.    Allergies as of 01/19/2023   (No Known Allergies)    No past medical history on file.  Past Surgical History:  Procedure Laterality Date   FINGER SURGERY     HERNIA REPAIR     TONSILLECTOMY      Family History  Problem Relation Age of Onset   Hypertension Mother    Prostate cancer Father     Social History   Socioeconomic History   Marital status: Single    Spouse name: Not on file   Number of children: Not on file   Years of education: Not on file   Highest education level: Not on file  Occupational History   Not on file  Tobacco Use   Smoking status: Never   Smokeless tobacco: Never  Vaping Use   Vaping status: Never Used  Substance and Sexual Activity   Alcohol use: Yes    Comment: occ   Drug use: Not Currently   Sexual activity: Not on file  Other Topics Concern   Not on file  Social History  Narrative   Not on file   Social Determinants of Health   Financial Resource Strain: Not on file  Food Insecurity: Not on file  Transportation Needs: Not on file  Physical Activity: Not on file  Stress: Not on file  Social Connections: Unknown (10/03/2021)   Received from [facility], [facility]   Social Network    Social Network: Not on file  Intimate Partner Violence: Unknown (09/01/2021)   Received from [facility], [facility]   HITS    Physically Hurt: Not on file    Insult or Talk Down To: Not on file    Threaten Physical Harm: Not on file    Scream or Curse: Not on file    Review of Systems  Constitutional:  Positive for fatigue.  Respiratory:  Positive for apnea.   Psychiatric/Behavioral:  Positive for sleep disturbance.     Vitals:   01/19/23 0846  BP: 122/82  Pulse: 71  SpO2: 96%     Physical Exam Constitutional:      Appearance: He is obese.  HENT:     Head: Normocephalic.     Mouth/Throat:     Mouth: Mucous membranes are moist.     Comments: Crowded oropharynx, Mallampati 3 Eyes:  General: No scleral icterus.    Pupils: Pupils are equal, round, and reactive to light.  Cardiovascular:     Rate and Rhythm: Normal rate and regular rhythm.     Heart sounds: No murmur heard.    No friction rub.  Pulmonary:     Effort: No respiratory distress.     Breath sounds: No stridor. No wheezing or rhonchi.  Musculoskeletal:     Cervical back: No rigidity or tenderness.  Neurological:     Mental Status: He is alert.  Psychiatric:        Mood and Affect: Mood normal.       01/19/2023    9:00 AM 11/15/2020   10:00 AM  Results of the Epworth flowsheet  Sitting and reading 2 2  Watching TV 2 2  Sitting, inactive in a public place (e.g. a theatre or a meeting) 1 2  As a passenger in a car for an hour without a break 1 2  Lying down to rest in the afternoon when circumstances permit 3 3  Sitting and talking to someone 0 0  Sitting quietly after a  lunch without alcohol 2 0  In a car, while stopped for a few minutes in traffic 0 0  Total score 11 11     Data Reviewed: Previous sleep study was reviewed by myself showing AHI of 13.5  Assessment:  Mild obstructive sleep apnea with excessive daytime sleepiness  Class I obesity  Daytime fatigue  Snoring  Pathophysiology of sleep disordered breathing reviewed with the patient Treatment options discussed  Plan/Recommendations:  DME referral for auto CPAP 5-15 with heated humidification  Discussed age of a study being more than 2 years, symptoms remain about the same with excessive daytime sleepiness, nonrestorative sleep  Encouraged weight loss measures  Encouraged to call with significant concerns  Tentative follow-up in about 3 months   Virl Diamond MD [facility] 01/19/2023, 8:48 AM  CC: [place], Betty G, MD

## 2023-01-19 NOTE — Addendum Note (Signed)
Addended by: Lanna Poche on: 01/19/2023 09:18 AM   Modules accepted: Orders

## 2023-01-19 NOTE — Patient Instructions (Signed)
DME referral for auto CPAP  Auto CPAP 5-15 with heated humidification with patient's mask of choice  Weight loss measures  Call us with significant concerns  Tentative follow-up in about 3 months

## 2023-02-24 ENCOUNTER — Telehealth: Payer: Self-pay | Admitting: Pulmonary Disease

## 2023-02-24 NOTE — Telephone Encounter (Signed)
Patient states DME companies for CPAP machine. DME companies [employer] [facility], [facility] and [facility].Patient phone number is [phone].

## 2023-02-25 NOTE — Telephone Encounter (Signed)
I have now sent the order to [facility] per patient's request

## 2023-03-22 NOTE — Telephone Encounter (Signed)
PT calling about status of his CPAP order. On 9/26 it says you sent an order to [facility]. It's been a month since then.Please call PT to advise. Thanks. [phone]

## 2023-03-22 NOTE — Telephone Encounter (Signed)
I have tried to call [facility] 3x's today but no one answered the phone I will try again tomorrow

## 2023-03-23 NOTE — Telephone Encounter (Signed)
I received a message from [place] with [facility], [place]  This pt is out of network, we are unable to bill this insurance, I will notify the pt but it will need to be sent somewhere else.  When I spoke with Penni Homans with [facility] I noticed the patient has a deductible of 3000.00 and he would probably have to pay out of pocket either way.  I called the patient's insurance  and they gave me [facility] phone # [phone] they are based out of [place], they gave me [facility] phone # [phone] when I tried to call them the message states I'm sorry the voice mailbox has not been setup yet.  They gave me [facility] phone # [phone] the message states your call can't be completed at this time. Please try your call later.  I have faxed the Cpap order to [facility] because they are the only ones that answered the number given to me

## 2023-03-23 NOTE — Telephone Encounter (Signed)
I finally got to speak with Ashly with [facility]. He stated he would pull the order and call the patient

## 2023-04-06 NOTE — Telephone Encounter (Signed)
I spoke with Bonita Quin with [facility] she confirmed they got this order and on 04/01/23 they spoke with the patient and he didn't want to pay the amount they were telling him. They CXL this order .

## 2023-04-22 ENCOUNTER — Ambulatory Visit: Payer: PRIVATE HEALTH INSURANCE | Admitting: Pulmonary Disease

## 2023-07-01 ENCOUNTER — Ambulatory Visit: Payer: PRIVATE HEALTH INSURANCE | Admitting: Pulmonary Disease

## 2023-07-08 ENCOUNTER — Telehealth: Payer: Self-pay | Admitting: *Deleted

## 2023-07-08 NOTE — Telephone Encounter (Signed)
 Called and spoke with patient, he states that he does not have a CPAP, [facility] will not provide him with the CPAP machine without an OV.

## 2023-07-09 ENCOUNTER — Telehealth (INDEPENDENT_AMBULATORY_CARE_PROVIDER_SITE_OTHER): Payer: PRIVATE HEALTH INSURANCE | Admitting: Adult Health

## 2023-07-09 ENCOUNTER — Encounter: Payer: Self-pay | Admitting: Adult Health

## 2023-07-09 DIAGNOSIS — G4733 Obstructive sleep apnea (adult) (pediatric): Secondary | ICD-10-CM | POA: Diagnosis not present

## 2023-07-09 NOTE — Patient Instructions (Signed)
 Begin CPAP at bedtime, wear all night long for at least 6 or more hours Work on healthy weight loss Do not drive if sleepy Follow-up in 3 months and as needed

## 2023-07-09 NOTE — Progress Notes (Signed)
 Virtual Visit via Video Note  I connected with Martin Arnold on 07/09/23 at  2:30 PM EST by a video enabled telemedicine application and verified that I am speaking with the correct person using two identifiers.  Location: Patient: home  Provider: office    I discussed the limitations of evaluation and management by telemedicine and the availability of in person appointments. The patient expressed understanding and agreed to proceed.  History of Present Illness: 33 year old male followed for sleep apnea on nocturnal CPAP  Today's video visit is a 63-month follow-up for sleep apnea.  Patient was diagnosed with sleep apnea in 2022.  He was recommended started on CPAP therapy but unfortunately was unable to get coverage through his insurance that was affordable.  Patient returns last year with ongoing symptoms of snoring, restless sleep and daytime sleepiness.  He was recommended to start on CPAP.  Patient says has been trying to get CPAP since his last visit but has been having trouble with his insurance approval and with the DME company.  Most recently was told that he needed a new office visit in order to be qualified for CPAP.  Patient says he still has snoring, restless sleep feel tired during the daytime and wants to get on CPAP to help with the symptoms.     Observations/Objective: sleep study was in 2022, August 11, 2020 showing AHI of 13.5.   07/09/2023 NAD   Assessment and Plan: Mild to moderate obstructive sleep apnea with significant symptom burden.  Patient to begin CPAP therapy CPAP auto 5 to 15 cm H2O. If not affordable through DME company could consider buying directly from cpap.com or [employer].  Patient to reach back out to let us  know if he needs a prescription.  We also talked about CPAP care and possible mask such as DreamWear nasal- he has significant facial hair and might do better on this mask  Plan  . Patient Instructions  Begin CPAP at bedtime, wear all night long for at  least 6 or more hours Work on healthy weight loss Do not drive if sleepy Follow-up in 3 months and as needed    Follow Up Instructions:    I discussed the assessment and treatment plan with the patient. The patient was provided an opportunity to ask questions and all were answered. The patient agreed with the plan and demonstrated an understanding of the instructions.   The patient was advised to call back or seek an in-person evaluation if the symptoms worsen or if the condition fails to improve as anticipated.  I provided 22 minutes of non-face-to-face time during this encounter.   Madelin Stank, NP

## 2023-08-04 ENCOUNTER — Ambulatory Visit
Admission: EM | Admit: 2023-08-04 | Discharge: 2023-08-04 | Disposition: A | Discharge status: Home / Self Care | Payer: PRIVATE HEALTH INSURANCE | Attending: Family Medicine | Admitting: Family Medicine

## 2023-08-04 DIAGNOSIS — R238 Other skin changes: Secondary | ICD-10-CM

## 2023-08-04 MED ORDER — FLUCONAZOLE 150 MG PO TABS: 150.0000 mg | ORAL_TABLET | ORAL | 0 refills | Status: DC

## 2023-08-04 NOTE — ED Triage Notes (Signed)
"  My left foot has been itching for a while now, ? Athlete's foot of left, sometimes red, sometimes improved with OTC treatment but recurrent". "I also am having severe pain in my knee's for a year now". No recently injury "but play basketball a lot". I do have a PCP and she is following me for the knee pain.

## 2023-08-04 NOTE — Discharge Instructions (Signed)
 You may use over the counter clotrimazole cream and Domeboro solution soaks as directed.

## 2023-08-04 NOTE — ED Provider Notes (Signed)
  [facility]   [id number] 08/04/23 Arrival Time: 1043  ASSESSMENT & PLAN:  1. Skin irritation    Suspect tinea pedis. PE not too impressive but with significant itching under toes of L foot. No signs of bacterial skin infection.   Discharge Instructions      You may use over the counter clotrimazole cream and Domeboro solution soaks as directed.      Meds ordered this encounter  Medications   fluconazole (DIFLUCAN) 150 MG tablet    Sig: Take 1 tablet (150 mg total) by mouth once a week.    Dispense:  6 tablet    Refill:  0   Derm referral placed for f/u should he not improve.  Reviewed expectations re: course of current medical issues. Questions answered. Outlined signs and symptoms indicating need for more acute intervention. Patient verbalized understanding. After Visit Summary given.   SUBJECTIVE:  Martin Arnold is a 33 y.o. male who presents with a skin complaint. Reports itching under toes of LEFT foot for 1-2 months. OTC Lamictal spray without much relief. Otherwise well.   OBJECTIVE: Vitals:   08/04/23 1056 08/04/23 1058  BP:  134/83  Pulse:  74  Resp:  18  Temp:  98.3 F (36.8 C)  TempSrc:  Oral  SpO2:  97%  Weight: 94.3 kg   Height: 5\' 5"  (1.651 m)     General appearance: alert; no distress Skin: warm and dry; mildly erythematous/irritated/thickened skin under toes of LEFT foot, specifically around 3rd and 4th toes; without open wounds; without weeping/drainage/bleeding Psychological: alert and cooperative; normal mood and affect  No Known Allergies  History reviewed. No pertinent past medical history. Social History   Socioeconomic History   Marital status: Single    Spouse name: Not on file   Number of children: Not on file   Years of education: Not on file   Highest education level: Not on file  Occupational History   Not on file  Tobacco Use   Smoking status: Never   Smokeless tobacco: Never  Vaping Use   Vaping status:  Never Used  Substance and Sexual Activity   Alcohol use: Yes    Comment: Occassionally.   Drug use: Not Currently   Sexual activity: Not Currently  Other Topics Concern   Not on file  Social History Narrative   Not on file   Social Drivers of Health   Financial Resource Strain: Not on file  Food Insecurity: Not on file  Transportation Needs: Not on file  Physical Activity: Not on file  Stress: Not on file  Social Connections: Unknown (10/03/2021)   Received from [facility], [facility]   Social Network    Social Network: Not on file  Intimate Partner Violence: Unknown (09/01/2021)   Received from [facility], [facility]   HITS    Physically Hurt: Not on file    Insult or Talk Down To: Not on file    Threaten Physical Harm: Not on file    Scream or Curse: Not on file   Family History  Problem Relation Age of Onset   Hypertension Mother    Prostate cancer Father    Past Surgical History:  Procedure Laterality Date   FINGER SURGERY     HERNIA REPAIR     TONSILLECTOMY        Mardella Layman, MD 08/04/23 1505

## 2023-09-08 ENCOUNTER — Other Ambulatory Visit: Payer: Self-pay

## 2023-09-08 DIAGNOSIS — G4733 Obstructive sleep apnea (adult) (pediatric): Secondary | ICD-10-CM

## 2023-09-08 NOTE — Progress Notes (Signed)
 Patient came by the office to pick up the script for the CPAP Nothing else further needed.

## 2023-09-28 ENCOUNTER — Telehealth: Payer: PRIVATE HEALTH INSURANCE | Admitting: Family Medicine

## 2023-09-29 NOTE — Progress Notes (Signed)
 Virtual Visit via Video Note I connected with Martin Arnold on 10/01/2023 by a video enabled telemedicine application and verified that I am speaking with the correct person using two identifiers. Location patient: home Location provider:work office Persons participating in the virtual visit: patient, provider, scribe  I discussed the limitations of evaluation and management by telemedicine and the availability of in person appointments. The patient expressed understanding and agreed to proceed.  Chief Complaint  Patient presents with   Knee Pain   HPI: Martin Arnold is a 33 y.o. male with a PMHx significant for OSA, and GERD  who is being seen today on video for knee pain.    Patient complains of bilateral knee pain since 06/2022. He had an injury in his right knee while playing basketball where he planted hard on the knee. He tried biking instead for a few weeks and began to have pain in his other knee as well.   Knee Pain  The incident occurred more than 1 week ago. The injury mechanism is unknown. The pain is present in the right knee and left knee. The pain is at a severity of 8/10. The pain is moderate. The pain has been Intermittent since onset. Pertinent negatives include no inability to bear weight, loss of motion, loss of sensation, muscle weakness, numbness or tingling. The symptoms are aggravated by movement. He has tried rest for the symptoms. The treatment provided moderate relief.   He has taken almost a year off of playing basketball, but tried playing again on 09/12/2023 and had swelling and pain the next morning.   He describes the pain as an achy, throbbing sensation in the middle of the joints, and rates it as a 7-8/10. He feels like he has fluid in the knees, which he says is limiting his range of motion. The right knee is worse than the left one.  The knee feels unstable to him, and he has some difficulty walking up stairs.  Not taking anything for pain at this point.   He  has been to physical therapy but stopped going because he was having increased pain at appointments.  Denies associated fever, chills, knee erythema, or other recent joint pain outside of chronic right elbow pain.   ROS: See pertinent positives and negatives per HPI.  History reviewed. No pertinent past medical history.  Past Surgical History:  Procedure Laterality Date   FINGER SURGERY     HERNIA REPAIR     TONSILLECTOMY     Family History  Problem Relation Age of Onset   Hypertension Mother    Prostate cancer Father    Social History   Socioeconomic History   Marital status: Single    Spouse name: Not on file   Number of children: Not on file   Years of education: Not on file   Highest education level: Not on file  Occupational History   Not on file  Tobacco Use   Smoking status: Never   Smokeless tobacco: Never  Vaping Use   Vaping status: Never Used  Substance and Sexual Activity   Alcohol use: Yes    Comment: Occassionally.   Drug use: Not Currently   Sexual activity: Not Currently  Other Topics Concern   Not on file  Social History Narrative   Not on file   Social Drivers of Health   Financial Resource Strain: Not on file  Food Insecurity: Not on file  Transportation Needs: Not on file  Physical Activity: Not on file  Stress: Not on file  Social Connections: Unknown (10/03/2021)   Received from [facility], [facility]   Social Network    Social Network: Not on file  Intimate Partner Violence: Unknown (09/01/2021)   Received from [facility], [facility]   HITS    Physically Hurt: Not on file    Insult or Talk Down To: Not on file    Threaten Physical Harm: Not on file    Scream or Curse: Not on file    Current Outpatient Medications:    meloxicam (MOBIC) 15 MG tablet, Take 1 tablet (15 mg total) by mouth daily., Disp: 30 tablet, Rfl: 0   tolnaftate (TINACTIN) 1 % spray, Apply topically 2 (two) times daily., Disp: , Rfl:   EXAM:  VITALS  per patient if applicable:Ht 5\' 5"  (1.651 m)   BMI 34.61 kg/m   GENERAL: alert, oriented, appears well and in no acute distress  HEENT: atraumatic, conjunctiva clear, no obvious abnormalities on inspection of external nose and ears  NECK: normal movements of the head and neck  LUNGS: on inspection no signs of respiratory distress, breathing rate appears normal, no obvious gross SOB, gasping or wheezing  CV: no obvious cyanosis  MS: moves all visible extremities without noticeable abnormality  PSYCH/NEURO: pleasant and cooperative, no obvious depression or anxiety, speech and thought processing grossly intact  ASSESSMENT AND PLAN:  Discussed the following assessment and plan:  Chronic pain of both knees Assessment & Plan: Right worse than left, gradually getting worse. Problem has been going on since 06/2022. Explained that hx does not suggest a serious process. Because it has been persistent and affecting her ability to play basketball, I think sport medicine evaluation is warrant. Initially reluctant but he agrees with referral. In regard to pain management, he has agreed with trying meloxicam 50 mg daily for 10 days then as needed.  We discussed some side effects. to pain management, he has not tried OTC medications, he agrees with Meloxicam 15 mg daily for 10 days then prn. We discussed some side effects.  Orders: -     Ambulatory referral to Sports Medicine -     Meloxicam; Take 1 tablet (15 mg total) by mouth daily.  Dispense: 30 tablet; Refill: 0   We discussed possible serious and likely etiologies, options for evaluation and workup, limitations of telemedicine visit vs in person visit, treatment, treatment risks and precautions. The patient was advised to call back or seek an in-person evaluation if the symptoms worsen or if the condition fails to improve as anticipated. I discussed the assessment and treatment plan with the patient. The patient was provided an opportunity  to ask questions and all were answered. The patient agreed with the plan and demonstrated an understanding of the instructions.  Return if symptoms worsen or fail to improve, for keep next appointment.  I, Fritz Jewel Wierda, acting as a scribe for Melanie Pellot [place], MD., have documented all relevant documentation on the behalf of Annsleigh Dragoo [place], MD, as directed by  Shyann Hefner [place], MD while in the presence of Izaya Netherton [place], MD.   I, Morning Halberg [place], MD, have reviewed all documentation for this visit. The documentation on 10/01/23 for the exam, diagnosis, procedures, and orders are all accurate and complete.  Deneshia Zucker [place], MD

## 2023-10-01 ENCOUNTER — Encounter: Payer: Self-pay | Admitting: Family Medicine

## 2023-10-01 ENCOUNTER — Telehealth (INDEPENDENT_AMBULATORY_CARE_PROVIDER_SITE_OTHER): Payer: PRIVATE HEALTH INSURANCE | Admitting: Family Medicine

## 2023-10-01 VITALS — Ht 65.0 in

## 2023-10-01 DIAGNOSIS — M25562 Pain in left knee: Secondary | ICD-10-CM

## 2023-10-01 DIAGNOSIS — G8929 Other chronic pain: Secondary | ICD-10-CM | POA: Diagnosis not present

## 2023-10-01 DIAGNOSIS — M25561 Pain in right knee: Secondary | ICD-10-CM

## 2023-10-01 MED ORDER — MELOXICAM 15 MG PO TABS: 15.0000 mg | ORAL_TABLET | Freq: Every day | ORAL | 0 refills | Status: AC

## 2023-10-01 NOTE — Assessment & Plan Note (Addendum)
 Right worse than left, gradually getting worse. Problem has been going on since 06/2022. Explained that hx does not suggest a serious process. Because it has been persistent and affecting her ability to play basketball, I think sport medicine evaluation is warrant. Initially reluctant but he agrees with referral. In regard to pain management, he has agreed with trying meloxicam 50 mg daily for 10 days then as needed.  We discussed some side effects. to pain management, he has not tried OTC medications, he agrees with Meloxicam 15 mg daily for 10 days then prn. We discussed some side effects.

## 2023-10-08 NOTE — Progress Notes (Unsigned)
   Joanna Muck, PhD, LAT, ATC acting as a scribe for Garlan Juniper, MD.  Martin Arnold is a 33 y.o. male who presents to [facility] at [facility] today for bilat knee pain ongoing since Jan 2024. He recalls an injury to his R knee while playing basketball. Pt locates pain to ***  Knee swelling: Mechanical symptoms: Aggravates: Treatments tried: rest  Pertinent review of systems: ***  Relevant historical information: ***   Exam:  There were no vitals taken for this visit. General: Well Developed, well nourished, and in no acute distress.   MSK: ***    Lab and Radiology Results No results found for this or any previous visit (from the past 72 hours). No results found.     Assessment and Plan: 33 y.o. male with ***   PDMP not reviewed this encounter. No orders of the defined types were placed in this encounter.  No orders of the defined types were placed in this encounter.    Discussed warning signs or symptoms. Please see discharge instructions. Patient expresses understanding.   ***

## 2023-10-11 ENCOUNTER — Other Ambulatory Visit: Payer: Self-pay

## 2023-10-11 ENCOUNTER — Ambulatory Visit (INDEPENDENT_AMBULATORY_CARE_PROVIDER_SITE_OTHER): Payer: PRIVATE HEALTH INSURANCE | Admitting: Family Medicine

## 2023-10-11 ENCOUNTER — Ambulatory Visit (INDEPENDENT_AMBULATORY_CARE_PROVIDER_SITE_OTHER): Payer: PRIVATE HEALTH INSURANCE

## 2023-10-11 VITALS — BP 128/84 | HR 64 | Ht 65.0 in | Wt 216.0 lb

## 2023-10-11 DIAGNOSIS — M25521 Pain in right elbow: Secondary | ICD-10-CM

## 2023-10-11 DIAGNOSIS — M25561 Pain in right knee: Secondary | ICD-10-CM

## 2023-10-11 DIAGNOSIS — M25562 Pain in left knee: Secondary | ICD-10-CM

## 2023-10-11 DIAGNOSIS — G8929 Other chronic pain: Secondary | ICD-10-CM | POA: Diagnosis not present

## 2023-10-11 NOTE — Patient Instructions (Addendum)
 Thank you for coming in today.   Please get an Xray today before you leave   I've referred you to Physical Therapy here.  You can schedule your 1st visit at the check-out desk before you leave today.  Let me know how you feel

## 2023-10-13 ENCOUNTER — Ambulatory Visit: Payer: Self-pay | Admitting: Family Medicine

## 2023-10-13 NOTE — Progress Notes (Signed)
 Right knee x-ray shows mild arthritis.

## 2023-10-13 NOTE — Progress Notes (Signed)
 Left knee x-ray shows mild arthritis.

## 2023-10-20 ENCOUNTER — Encounter: Payer: PRIVATE HEALTH INSURANCE | Admitting: Family Medicine

## 2023-10-20 ENCOUNTER — Ambulatory Visit (INDEPENDENT_AMBULATORY_CARE_PROVIDER_SITE_OTHER): Payer: PRIVATE HEALTH INSURANCE | Admitting: Family Medicine

## 2023-10-20 ENCOUNTER — Encounter: Payer: Self-pay | Admitting: Family Medicine

## 2023-10-20 VITALS — BP 120/80 | HR 80 | Resp 12 | Ht 65.0 in | Wt 210.2 lb

## 2023-10-20 DIAGNOSIS — Z1329 Encounter for screening for other suspected endocrine disorder: Secondary | ICD-10-CM | POA: Diagnosis not present

## 2023-10-20 DIAGNOSIS — Z13228 Encounter for screening for other metabolic disorders: Secondary | ICD-10-CM

## 2023-10-20 DIAGNOSIS — Z0000 Encounter for general adult medical examination without abnormal findings: Secondary | ICD-10-CM

## 2023-10-20 DIAGNOSIS — Z130 Encounter for screening for diseases of the blood and blood-forming organs and certain disorders involving the immune mechanism: Secondary | ICD-10-CM | POA: Diagnosis not present

## 2023-10-20 LAB — POCT GLYCOSYLATED HEMOGLOBIN (HGB A1C): Hemoglobin A1C: 4.7 % (ref 4.0–5.6)

## 2023-10-20 NOTE — Patient Instructions (Addendum)
 A few things to remember from today's visit:  Routine general medical examination at a health care facility  Screening for endocrine, metabolic and immunity disorder - Plan: POC HgB A1c  Do not use My Chart to request refills or for acute issues that need immediate attention. If you send a my chart message, it may take a few days to be addressed, specially if I am not in the office.  Please be sure medication list is accurate. If a new problem present, please set up appointment sooner than planned today.  Health Maintenance, Male Adopting a healthy lifestyle and getting preventive care are important in promoting health and wellness. Ask your health care provider about: The right schedule for you to have regular tests and exams. Things you can do on your own to prevent diseases and keep yourself healthy. What should I know about diet, weight, and exercise? Eat a healthy diet  Eat a diet that includes plenty of vegetables, fruits, low-fat dairy products, and lean protein. Do not eat a lot of foods that are high in solid fats, added sugars, or sodium. Maintain a healthy weight Body mass index (BMI) is a measurement that can be used to identify possible weight problems. It estimates body fat based on height and weight. Your health care provider can help determine your BMI and help you achieve or maintain a healthy weight. Get regular exercise Get regular exercise. This is one of the most important things you can do for your health. Most adults should: Exercise for at least 150 minutes each week. The exercise should increase your heart rate and make you sweat (moderate-intensity exercise). Do strengthening exercises at least twice a week. This is in addition to the moderate-intensity exercise. Spend less time sitting. Even light physical activity can be beneficial. Watch cholesterol and blood lipids Have your blood tested for lipids and cholesterol at 35 years of age, then have this test  every 5 years. You may need to have your cholesterol levels checked more often if: Your lipid or cholesterol levels are high. You are older than 35 years of age. You are at high risk for heart disease. What should I know about cancer screening? Many types of cancers can be detected early and may often be prevented. Depending on your health history and family history, you may need to have cancer screening at various ages. This may include screening for: Colorectal cancer. Prostate cancer. Skin cancer. Lung cancer. What should I know about heart disease, diabetes, and high blood pressure? Blood pressure and heart disease High blood pressure causes heart disease and increases the risk of stroke. This is more likely to develop in people who have high blood pressure readings or are overweight. Talk with your health care provider about your target blood pressure readings. Have your blood pressure checked: Every 3-5 years if you are 60-64 years of age. Every year if you are 1 years old or older. If you are between the ages of 24 and 62 and are a current or former smoker, ask your health care provider if you should have a one-time screening for abdominal aortic aneurysm (AAA). Diabetes Have regular diabetes screenings. This checks your fasting blood sugar level. Have the screening done: Once every three years after age 4 if you are at a normal weight and have a low risk for diabetes. More often and at a younger age if you are overweight or have a high risk for diabetes. What should I know about preventing infection? Hepatitis B  If you have a higher risk for hepatitis B, you should be screened for this virus. Talk with your health care provider to find out if you are at risk for hepatitis B infection. Hepatitis C Blood testing is recommended for: Everyone born from [birth year] through 1967. Anyone with known risk factors for hepatitis C. Sexually transmitted infections (STIs) You should be screened  each year for STIs, including gonorrhea and chlamydia, if: You are sexually active and are younger than 35 years of age. You are older than 35 years of age and your health care provider tells you that you are at risk for this type of infection. Your sexual activity has changed since you were last screened, and you are at increased risk for chlamydia or gonorrhea. Ask your health care provider if you are at risk. Ask your health care provider about whether you are at high risk for HIV. Your health care provider may recommend a prescription medicine to help prevent HIV infection. If you choose to take medicine to prevent HIV, you should first get tested for HIV. You should then be tested every 3 months for as long as you are taking the medicine. Follow these instructions at home: Alcohol use Do not drink alcohol if your health care provider tells you not to drink. If you drink alcohol: Limit how much you have to 0-2 drinks a day. Know how much alcohol is in your drink. In the [place] one drink equals one 12 oz bottle of beer (355 mL), one 5 oz glass of wine (148 mL), or one 1 oz glass of hard liquor (44 mL). Lifestyle Do not use any products that contain nicotine or tobacco. These products include cigarettes, chewing tobacco, and vaping devices, such as e-cigarettes. If you need help quitting, ask your health care provider. Do not use street drugs. Do not share needles. Ask your health care provider for help if you need support or information about quitting drugs. General instructions Schedule regular health, dental, and eye exams. Stay current with your vaccines. Tell your health care provider if: You often feel depressed. You have ever been abused or do not feel safe at home. Summary Adopting a healthy lifestyle and getting preventive care are important in promoting health and wellness. Follow your health care provider's instructions about healthy diet, exercising, and getting tested or  screened for diseases. Follow your health care provider's instructions on monitoring your cholesterol and blood pressure. This information is not intended to replace advice given to you by your health care provider. Make sure you discuss any questions you have with your health care provider. Document Revised: 10/07/2020 Document Reviewed: 10/07/2020 Elsevier Patient Education  2024 [employer].

## 2023-10-20 NOTE — Progress Notes (Signed)
 HPI: Martin Arnold is a 33 y.o.male with a PMHx significant for OSA and GERD who is here today for his routine physical examination.  Last CPE: 10/19/2022  Exercise: Patient says he has not been exercising regularly due to knee pain.  Diet: He eats healthy in general and eats vegetables daily.  Sleep: 6-7 hours per night.  Alcohol Use: rare social use Smoking: never Vision: Not currently seeing an eye care provider.  Dental: UTD on routine dental care.   Immunization History  Administered Date(s) Administered  . Hepb-cpg 10/04/2023  . PFIZER(Purple Top)SARS-COV-2 Vaccination 02/15/2020, 03/09/2020  . Tdap 08/25/2017   Health Maintenance  Topic Date Due  . COVID-19 Vaccine (3 - 2024-25 season) 11/04/2023 (Originally 01/31/2023)  . INFLUENZA VACCINE  12/31/2023  . DTaP/Tdap/Td (2 - Td or Tdap) 08/26/2027  . Hepatitis C Screening  Completed  . HIV Screening  Completed  . HPV VACCINES  Aged Out  . Meningococcal B Vaccine  Aged Out   Lab Results  Component Value Date   CHOL 158 10/19/2022   HDL 53.10 10/19/2022   LDLCALC 92 10/19/2022   TRIG 66.0 10/19/2022   CHOLHDL 3 10/19/2022   Chronic medical problems:   Knee pain:  He mentions that he had bilateral intra-articular steroid injection on 10/11/2023. X-rays from that visit showed bilateral arthritis.  He is waiting for a phone call to set up physical therapy.   OSA: He does not have a CPAP yet, waiting for it.   No new concerns or problems today.   Review of Systems  Constitutional:  Negative for activity change, appetite change and fever.  HENT:  Negative for nosebleeds, sore throat and trouble swallowing.   Eyes:  Negative for redness and visual disturbance.  Respiratory:  Negative for cough, shortness of breath and wheezing.   Cardiovascular:  Negative for chest pain, palpitations and leg swelling.  Gastrointestinal:  Negative for abdominal pain, blood in stool, nausea and vomiting.  Endocrine: Negative for  cold intolerance, heat intolerance, polydipsia, polyphagia and polyuria.  Genitourinary:  Negative for decreased urine volume, dysuria, genital sores, hematuria and testicular pain.  Musculoskeletal:  Positive for arthralgias. Negative for myalgias.  Skin:  Negative for color change and rash.  Allergic/Immunologic: Negative for environmental allergies.  Neurological:  Negative for dizziness, syncope, weakness and headaches.  Hematological:  Negative for adenopathy. Does not bruise/bleed easily.  Psychiatric/Behavioral:  Negative for confusion and sleep disturbance.    Current Outpatient Medications on File Prior to Visit  Medication Sig Dispense Refill  . meloxicam  (MOBIC ) 15 MG tablet Take 1 tablet (15 mg total) by mouth daily. 30 tablet 0  . tolnaftate (TINACTIN) 1 % spray Apply topically 2 (two) times daily.     No current facility-administered medications on file prior to visit.   History reviewed. No pertinent past medical history.  Past Surgical History:  Procedure Laterality Date  . FINGER SURGERY    . HERNIA REPAIR    . TONSILLECTOMY     No Known Allergies  Family History  Problem Relation Age of Onset  . Hypertension Mother   . Prostate cancer Father    Social History   Socioeconomic History  . Marital status: Single    Spouse name: Not on file  . Number of children: Not on file  . Years of education: Not on file  . Highest education level: Not on file  Occupational History  . Not on file  Tobacco Use  . Smoking status: Never  .  Smokeless tobacco: Never  Vaping Use  . Vaping status: Never Used  Substance and Sexual Activity  . Alcohol use: Yes    Comment: Occassionally.  . Drug use: Not Currently  . Sexual activity: Not Currently  Other Topics Concern  . Not on file  Social History Narrative  . Not on file   Social Drivers of Health   Financial Resource Strain: Not on file  Food Insecurity: Not on file  Transportation Needs: Not on file  Physical  Activity: Not on file  Stress: Not on file  Social Connections: Unknown (10/03/2021)   Received from [facility], [facility]   Social Network   . Social Network: Not on file   Vitals:   10/20/23 1603  BP: 120/80  Pulse: 80  Resp: 12  SpO2: 96%   Body mass index is 34.99 kg/m.  Wt Readings from Last 3 Encounters:  10/20/23 210 lb 4 oz (95.4 kg)  10/11/23 216 lb (98 kg)  08/04/23 208 lb (94.3 kg)   Physical Exam Vitals and nursing note reviewed.  Constitutional:      General: He is not in acute distress.    Appearance: He is well-developed.  HENT:     Head: Normocephalic and atraumatic.     Right Ear: Tympanic membrane, ear canal and external ear normal.     Left Ear: Tympanic membrane, ear canal and external ear normal.     Mouth/Throat:     Mouth: Mucous membranes are moist.     Pharynx: Oropharynx is clear. Uvula midline.  Eyes:     Extraocular Movements: Extraocular movements intact.     Conjunctiva/sclera: Conjunctivae normal.     Pupils: Pupils are equal, round, and reactive to light.  Neck:     Thyroid: No thyroid mass or thyromegaly.  Cardiovascular:     Rate and Rhythm: Normal rate and regular rhythm.     Pulses:          Dorsalis pedis pulses are 2+ on the right side and 2+ on the left side.     Heart sounds: No murmur heard. Pulmonary:     Effort: Pulmonary effort is normal. No respiratory distress.     Breath sounds: Normal breath sounds.  Abdominal:     Palpations: Abdomen is soft. There is no hepatomegaly or mass.     Tenderness: There is no abdominal tenderness.  Genitourinary:    Comments: No concerns. Musculoskeletal:        General: No tenderness.     Cervical back: Normal range of motion.     Right lower leg: No edema.     Left lower leg: No edema.     Comments: No major deformities appreciated and no signs of synovitis.  Lymphadenopathy:     Cervical: No cervical adenopathy.     Upper Body:     Right upper body: No supraclavicular  adenopathy.     Left upper body: No supraclavicular adenopathy.  Skin:    General: Skin is warm.     Findings: No erythema.  Neurological:     General: No focal deficit present.     Mental Status: He is alert and oriented to person, place, and time.     Cranial Nerves: No cranial nerve deficit.     Sensory: No sensory deficit.     Motor: No weakness.     Gait: Gait normal.     Deep Tendon Reflexes:     Reflex Scores:  Bicep reflexes are 2+ on the right side and 2+ on the left side.      Patellar reflexes are 2+ on the right side and 2+ on the left side. Psychiatric:        Mood and Affect: Mood and affect normal.  ASSESSMENT AND PLAN:  Mr. Thebeau was seen today for his routine general medical examination.   Orders Placed This Encounter  Procedures  . POC HgB A1c   Lab Results  Component Value Date   HGBA1C 4.7 10/20/2023   Routine general medical examination at a health care facility Assessment & Plan: We discussed the importance of regular physical activity and healthy diet for prevention of chronic illness and/or complications. Preventive guidelines reviewed. Vaccination up-to-date.  He received hep B at his pharmacy recently and has an appointment in 10/2023 for the second dose. Next CPE in a year.   Screening for endocrine, metabolic and immunity disorder -     POCT glycosylated hemoglobin (Hb A1C)  Return in 1 year (on 10/19/2024) for CPE.  I, Odelia Bender, acting as a scribe for Alese Furniss [place], MD., have documented all relevant documentation on the behalf of Clark Cuff [place], MD, as directed by  Norrin Shreffler [place], MD while in the presence of Adriana Quinby [place], MD.   I, Keiston Manley [place], MD, have reviewed all documentation for this visit. The documentation on 10/20/23 for the exam, diagnosis, procedures, and orders are all accurate and complete.  Zara Wendt G. [place], MD  [facility]. [place] office.

## 2023-10-20 NOTE — Assessment & Plan Note (Signed)
 We discussed the importance of regular physical activity and healthy diet for prevention of chronic illness and/or complications. Preventive guidelines reviewed. Vaccination up-to-date.  He received hep B at his pharmacy recently and has an appointment in 10/2023 for the second dose. Next CPE in a year.

## 2023-12-31 ENCOUNTER — Ambulatory Visit
Admission: RE | Admit: 2023-12-31 | Discharge: 2023-12-31 | Disposition: A | Discharge status: Home / Self Care | Payer: PRIVATE HEALTH INSURANCE | Source: Ambulatory Visit

## 2023-12-31 DIAGNOSIS — J069 Acute upper respiratory infection, unspecified: Secondary | ICD-10-CM

## 2023-12-31 DIAGNOSIS — L03032 Cellulitis of left toe: Secondary | ICD-10-CM

## 2023-12-31 MED ORDER — DOXYCYCLINE HYCLATE 100 MG PO CAPS: 100.0000 mg | ORAL_CAPSULE | Freq: Two times a day (BID) | ORAL | 0 refills | Status: AC

## 2023-12-31 NOTE — ED Triage Notes (Signed)
 My left foot (5th toe) was itching me a lot and I was scratching it a lot and now it is red with some swelling and pain in foot now below toe as well.   I also have been coughing a lot over the past week or so, the cough is productive now and lingering with occasional body aches and this past Wednesday my jaw felt tight. I do have a PCP, I haven't contacted them about any of this. No fever known.

## 2024-01-02 NOTE — ED Provider Notes (Signed)
 [facility]    CSN: [id number] Arrival date & time: 12/31/23  1400      History   Chief Complaint Chief Complaint  Patient presents with   Cough   Toe Pain    HPI Martin Arnold is a 33 y.o. male.   Patient here today for evaluation of cough that he has had for the last week or so.  He reports that cough is productive.  He has some bodyaches as well.  He denies any fever.  He reports that he has swelling and pain to his left 5th toe that started after he was scratching it a lot recently.He denies any injury.  The history is provided by the patient.  Cough Associated symptoms: myalgias   Associated symptoms: no chills, no ear pain, no eye discharge, no fever, no shortness of breath and no sore throat   Toe Pain Pertinent negatives include no abdominal pain and no shortness of breath.    History reviewed. No pertinent past medical history.  Patient Active Problem List   Diagnosis Date Noted   Hyperlipidemia 10/19/2022   Pain in both knees 10/19/2022   Routine general medical examination at a health care facility 10/19/2022   OSA (obstructive sleep apnea) 11/15/2020   GERD (gastroesophageal reflux disease) 04/23/2020   Snoring 08/25/2017    Past Surgical History:  Procedure Laterality Date   FINGER SURGERY     HERNIA REPAIR     TONSILLECTOMY         Home Medications    Prior to Admission medications   Medication Sig Start Date End Date Taking? Authorizing Provider  doxycycline  (VIBRAMYCIN ) 100 MG capsule Take 1 capsule (100 mg total) by mouth 2 (two) times daily for 7 days. 12/31/23 01/07/24 Yes Billy Asberry FALCON, PA-C  [id number] (TYLENOL COLD & FLU DAY/NIGHT PO) Take by mouth.   Yes [provider]  meloxicam  (MOBIC ) 15 MG tablet Take 1 tablet (15 mg total) by mouth daily. 10/01/23   [place], Betty G, MD  tolnaftate (TINACTIN) 1 % spray Apply topically 2 (two) times daily.    [provider]    Family History Family  History  Problem Relation Age of Onset   Hypertension Mother    Prostate cancer Father     Social History Social History   Tobacco Use   Smoking status: Never   Smokeless tobacco: Never  Vaping Use   Vaping status: Never Used  Substance Use Topics   Alcohol use: Yes    Comment: Occassionally.   Drug use: Not Currently     Allergies   Patient has no known allergies.   Review of Systems Review of Systems  Constitutional:  Negative for chills and fever.  HENT:  Positive for congestion. Negative for ear pain and sore throat.   Eyes:  Negative for discharge and redness.  Respiratory:  Positive for cough. Negative for shortness of breath.   Gastrointestinal:  Negative for abdominal pain, nausea and vomiting.  Musculoskeletal:  Positive for myalgias.     Physical Exam Triage Vital Signs ED Triage Vitals  Encounter Vitals Group     BP 12/31/23 1417 124/83     Girls Systolic BP Percentile --      Girls Diastolic BP Percentile --      Boys Systolic BP Percentile --      Boys Diastolic BP Percentile --      Pulse Rate 12/31/23 1417 90     Resp 12/31/23 1417 18  Temp 12/31/23 1417 98.6 F (37 C)     Temp Source 12/31/23 1417 Oral     SpO2 12/31/23 1417 95 %     Weight 12/31/23 1414 205 lb (93 kg)     Height 12/31/23 1414 5' 5 (1.651 m)     Head Circumference --      Peak Flow --      Pain Score 12/31/23 1409 0     Pain Loc --      Pain Education --      Exclude from Growth Chart --    No data found.  Updated Vital Signs BP 124/83 (BP Location: Right Arm)   Pulse 90   Temp 98.6 F (37 C) (Oral)   Resp 18   Ht 5' 5 (1.651 m)   Wt 205 lb (93 kg)   SpO2 95%   BMI 34.11 kg/m   Visual Acuity Right Eye Distance:   Left Eye Distance:   Bilateral Distance:    Right Eye Near:   Left Eye Near:    Bilateral Near:     Physical Exam Vitals and nursing note reviewed.  Constitutional:      General: He is not in acute distress.    Appearance: Normal  appearance. He is not ill-appearing.  HENT:     Head: Normocephalic and atraumatic.     Nose: Nose normal. No congestion.     Mouth/Throat:     Mouth: Mucous membranes are moist.     Pharynx: Oropharynx is clear. No oropharyngeal exudate or posterior oropharyngeal erythema.  Eyes:     Conjunctiva/sclera: Conjunctivae normal.  Cardiovascular:     Rate and Rhythm: Normal rate and regular rhythm.     Heart sounds: Normal heart sounds. No murmur heard. Pulmonary:     Effort: Pulmonary effort is normal. No respiratory distress.     Breath sounds: Normal breath sounds. No wheezing, rhonchi or rales.  Musculoskeletal:     Comments: Mild swelling and erythema to left 5th toe  Skin:    General: Skin is warm and dry.  Neurological:     Mental Status: He is alert.     Comments: Gross sensation intact to distal left toe  Psychiatric:        Mood and Affect: Mood normal.        Thought Content: Thought content normal.      UC Treatments / Results  Labs (all labs ordered are listed, but only abnormal results are displayed) Labs Reviewed - No data to display  EKG   Radiology No results found.  Procedures Procedures (including critical care time)  Medications Ordered in UC Medications - No data to display  Initial Impression / Assessment and Plan / UC Course  I have reviewed the triage vital signs and the nursing notes.  Pertinent labs & imaging results that were available during my care of the patient were reviewed by me and considered in my medical decision making (see chart for details).    Doxycycline  prescribed to cover possible cellulitis of toe. Discussed suspected viral etiology of upper respiratory infection but antibiotic would hopefully cover any secondary infection as well. Recommended follow up if no gradual improvement or with any further concerns.   Final Clinical Impressions(s) / UC Diagnoses   Final diagnoses:  Acute upper respiratory infection  Cellulitis  of fifth toe, left   Discharge Instructions   None    ED Prescriptions     Medication Sig Dispense Auth. Provider  doxycycline  (VIBRAMYCIN ) 100 MG capsule Take 1 capsule (100 mg total) by mouth 2 (two) times daily for 7 days. 14 capsule Billy Asberry FALCON, PA-C      PDMP not reviewed this encounter.   Billy Asberry FALCON, PA-C 01/02/24 1103
